# Patient Record
Sex: Male | Born: 1980 | Race: White | Hispanic: No | Marital: Single
Health system: Southern US, Community
[De-identification: ages and names within clinical notes are randomized; demographics above are authoritative.]

---

## 2000-03-31 ENCOUNTER — Emergency Department (HOSPITAL_COMMUNITY): Admission: EM | Admit: 2000-03-31 | Discharge: 2000-03-31 | Payer: Self-pay | Admitting: Podiatry

## 2000-03-31 ENCOUNTER — Emergency Department (HOSPITAL_COMMUNITY): Admission: EM | Admit: 2000-03-31 | Discharge: 2000-03-31 | Payer: Self-pay | Admitting: Emergency Medicine

## 2000-04-01 ENCOUNTER — Emergency Department (HOSPITAL_COMMUNITY): Admission: EM | Admit: 2000-04-01 | Discharge: 2000-04-01 | Payer: Self-pay | Admitting: Emergency Medicine

## 2014-07-22 ENCOUNTER — Other Ambulatory Visit: Payer: Self-pay | Admitting: Physician Assistant

## 2014-07-22 ENCOUNTER — Ambulatory Visit
Admission: RE | Admit: 2014-07-22 | Discharge: 2014-07-22 | Disposition: A | Discharge status: Home / Self Care | Payer: 59 | Source: Ambulatory Visit | Attending: Physician Assistant | Admitting: Physician Assistant

## 2014-07-22 DIAGNOSIS — N50819 Testicular pain, unspecified: Secondary | ICD-10-CM

## 2014-07-22 DIAGNOSIS — N5089 Other specified disorders of the male genital organs: Secondary | ICD-10-CM

## 2015-01-20 ENCOUNTER — Other Ambulatory Visit: Payer: Self-pay | Admitting: Physician Assistant

## 2015-01-20 ENCOUNTER — Ambulatory Visit
Admission: RE | Admit: 2015-01-20 | Discharge: 2015-01-20 | Disposition: A | Discharge status: Home / Self Care | Payer: Self-pay | Source: Ambulatory Visit | Attending: Physician Assistant | Admitting: Physician Assistant

## 2015-01-20 DIAGNOSIS — M2550 Pain in unspecified joint: Secondary | ICD-10-CM

## 2016-03-29 IMAGING — CR DG HIP (WITH OR WITHOUT PELVIS) 5+V BILAT
2 series · 2 of 2 positions shown · non-contrast
Comparison: None.

CLINICAL DATA: Bilateral hip pain

EXAM:
BILATERAL HIP (WITH PELVIS) 5-6 VIEWS

[t hip frog leg right]
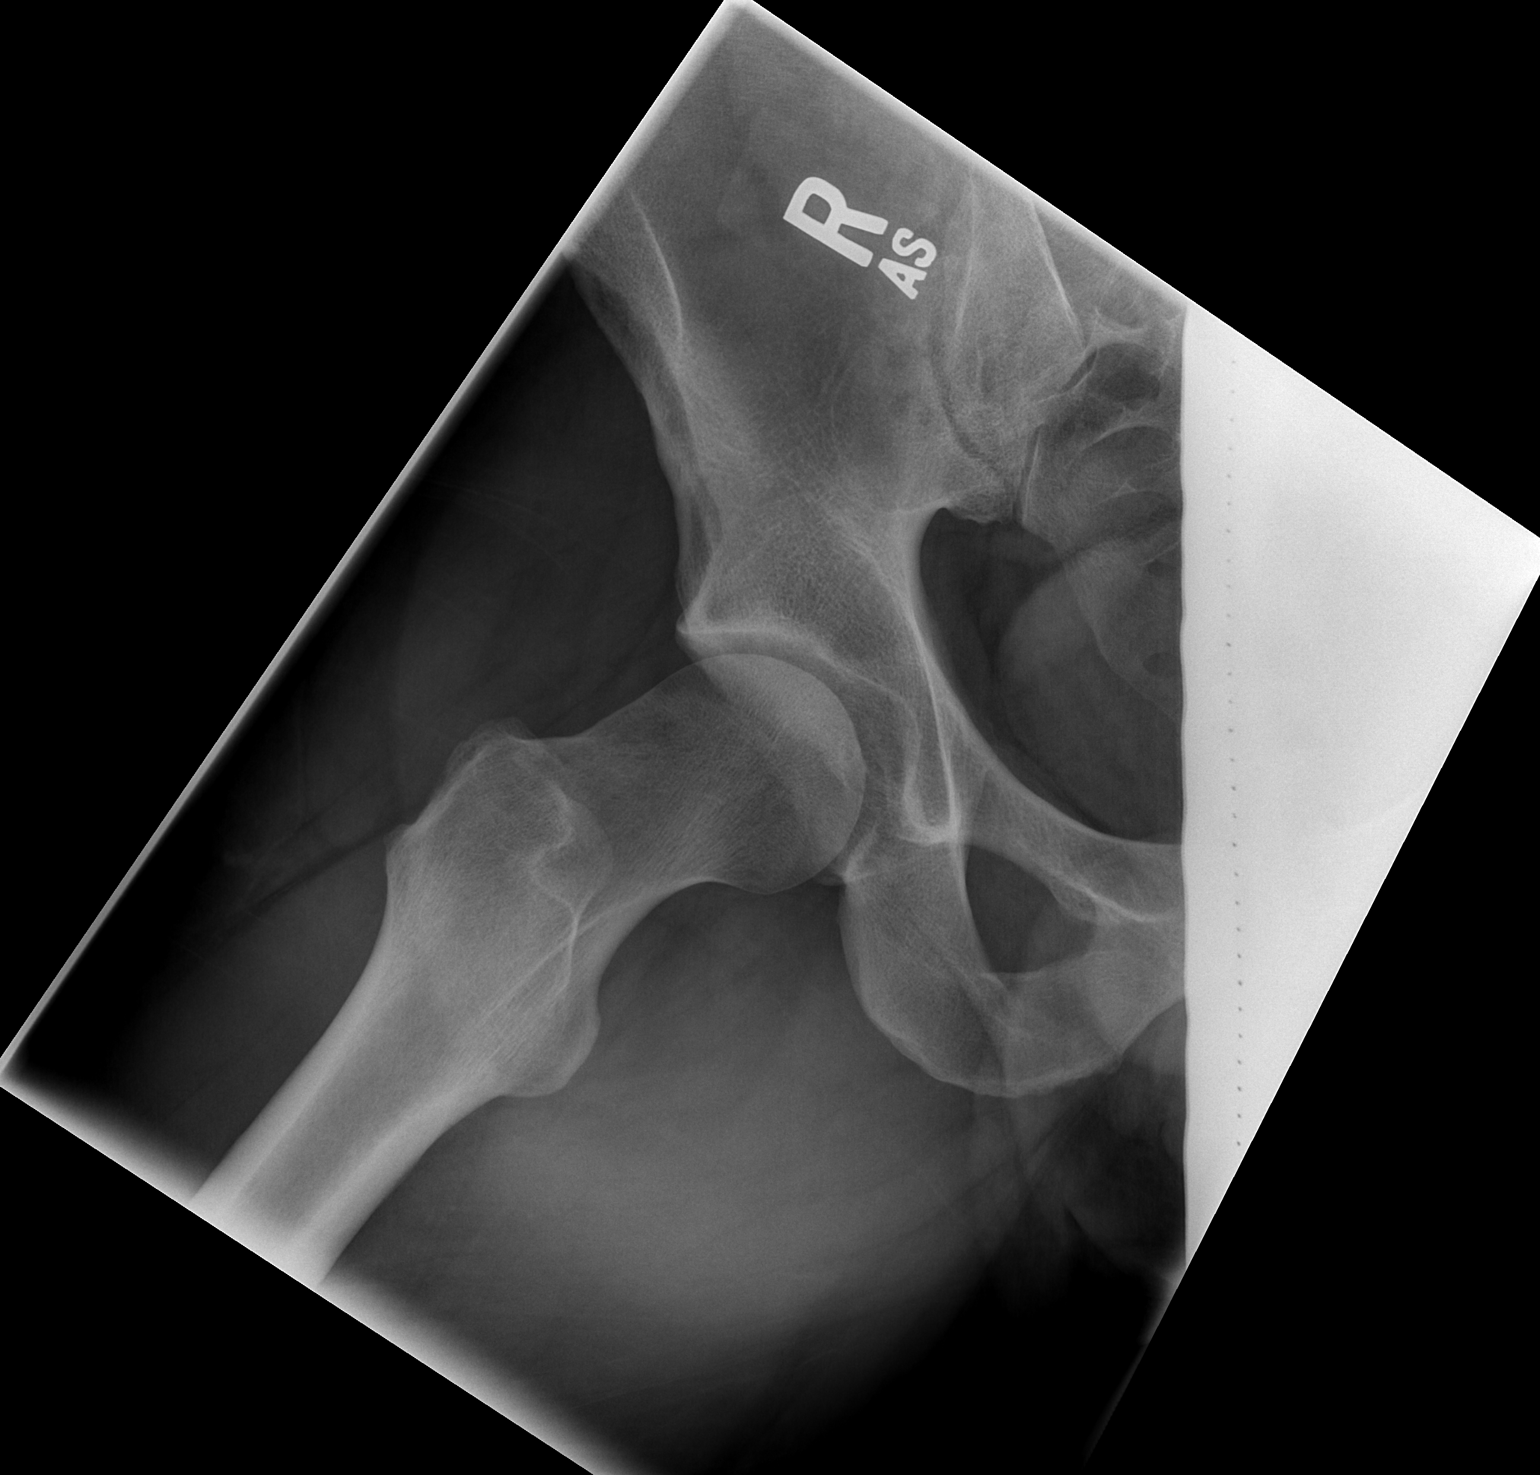

[t hip frog leg left]
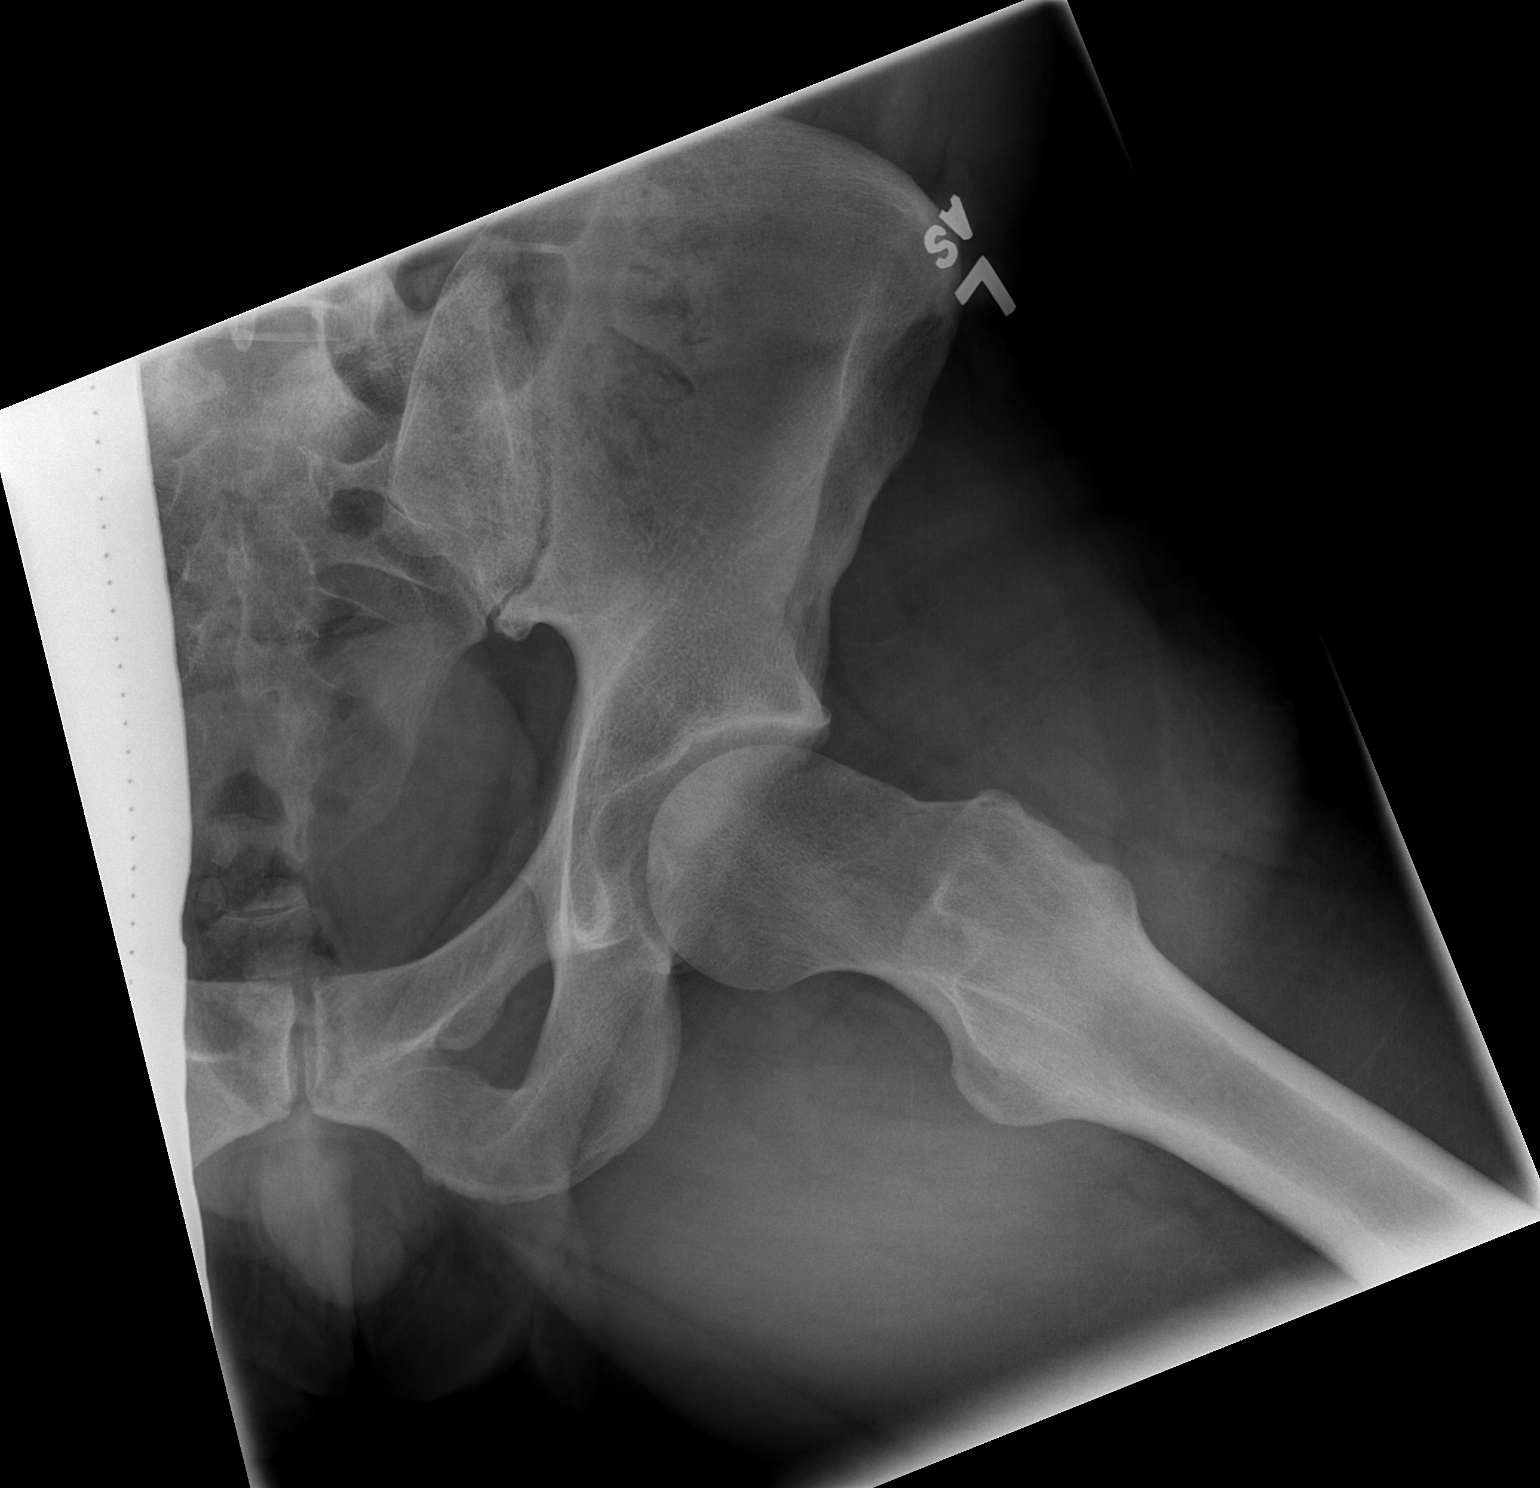

[2 of 2 positions shown; findings below may reference images not displayed]

FINDINGS: There is no evidence of hip fracture or dislocation. There is no
evidence of arthropathy or other focal bone abnormality.
IMPRESSION: Negative.

## 2016-03-29 IMAGING — CR DG KNEE COMPLETE 4+V*R*
3 series · 3 of 3 positions shown · non-contrast
Comparison: None.

CLINICAL DATA: Pain for approximately 8 months. Patient runs for
exercise. No trauma history.

EXAM:
RIGHT KNEE - COMPLETE 4+ VIEW

[w knee ap right]
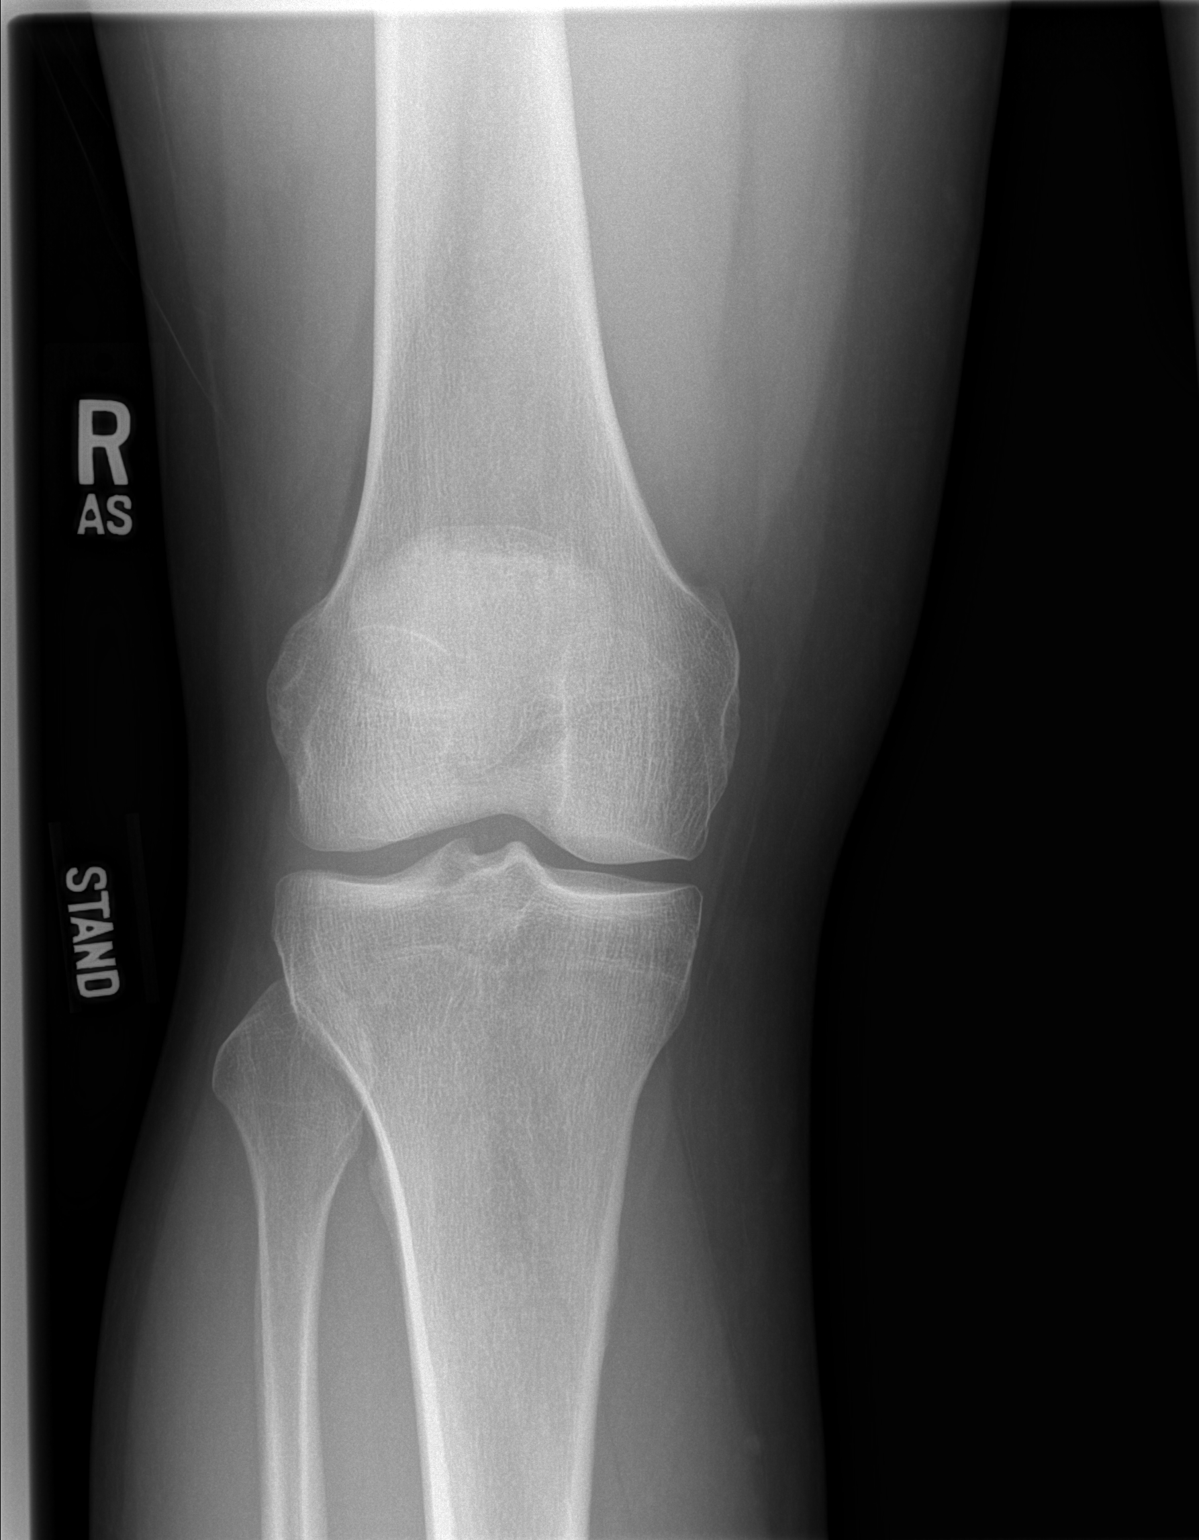

[w knee obl. right]
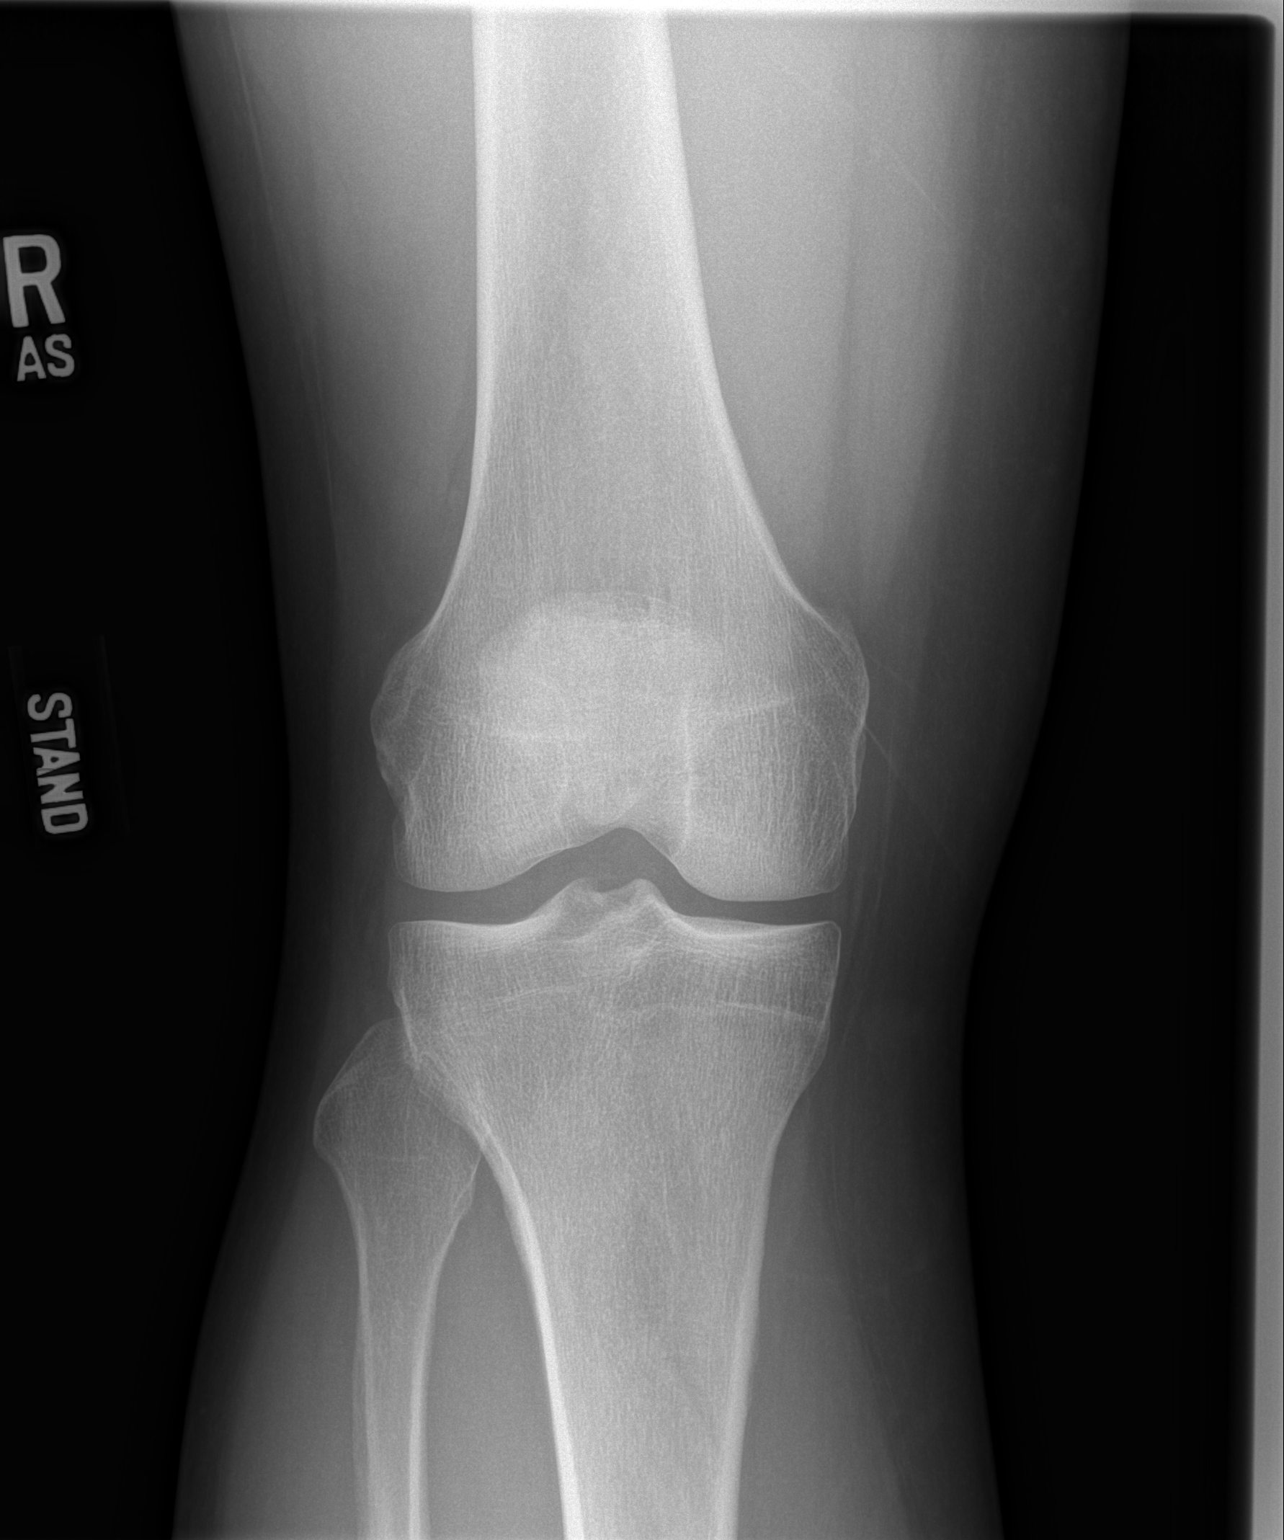

[w knee lat. right]
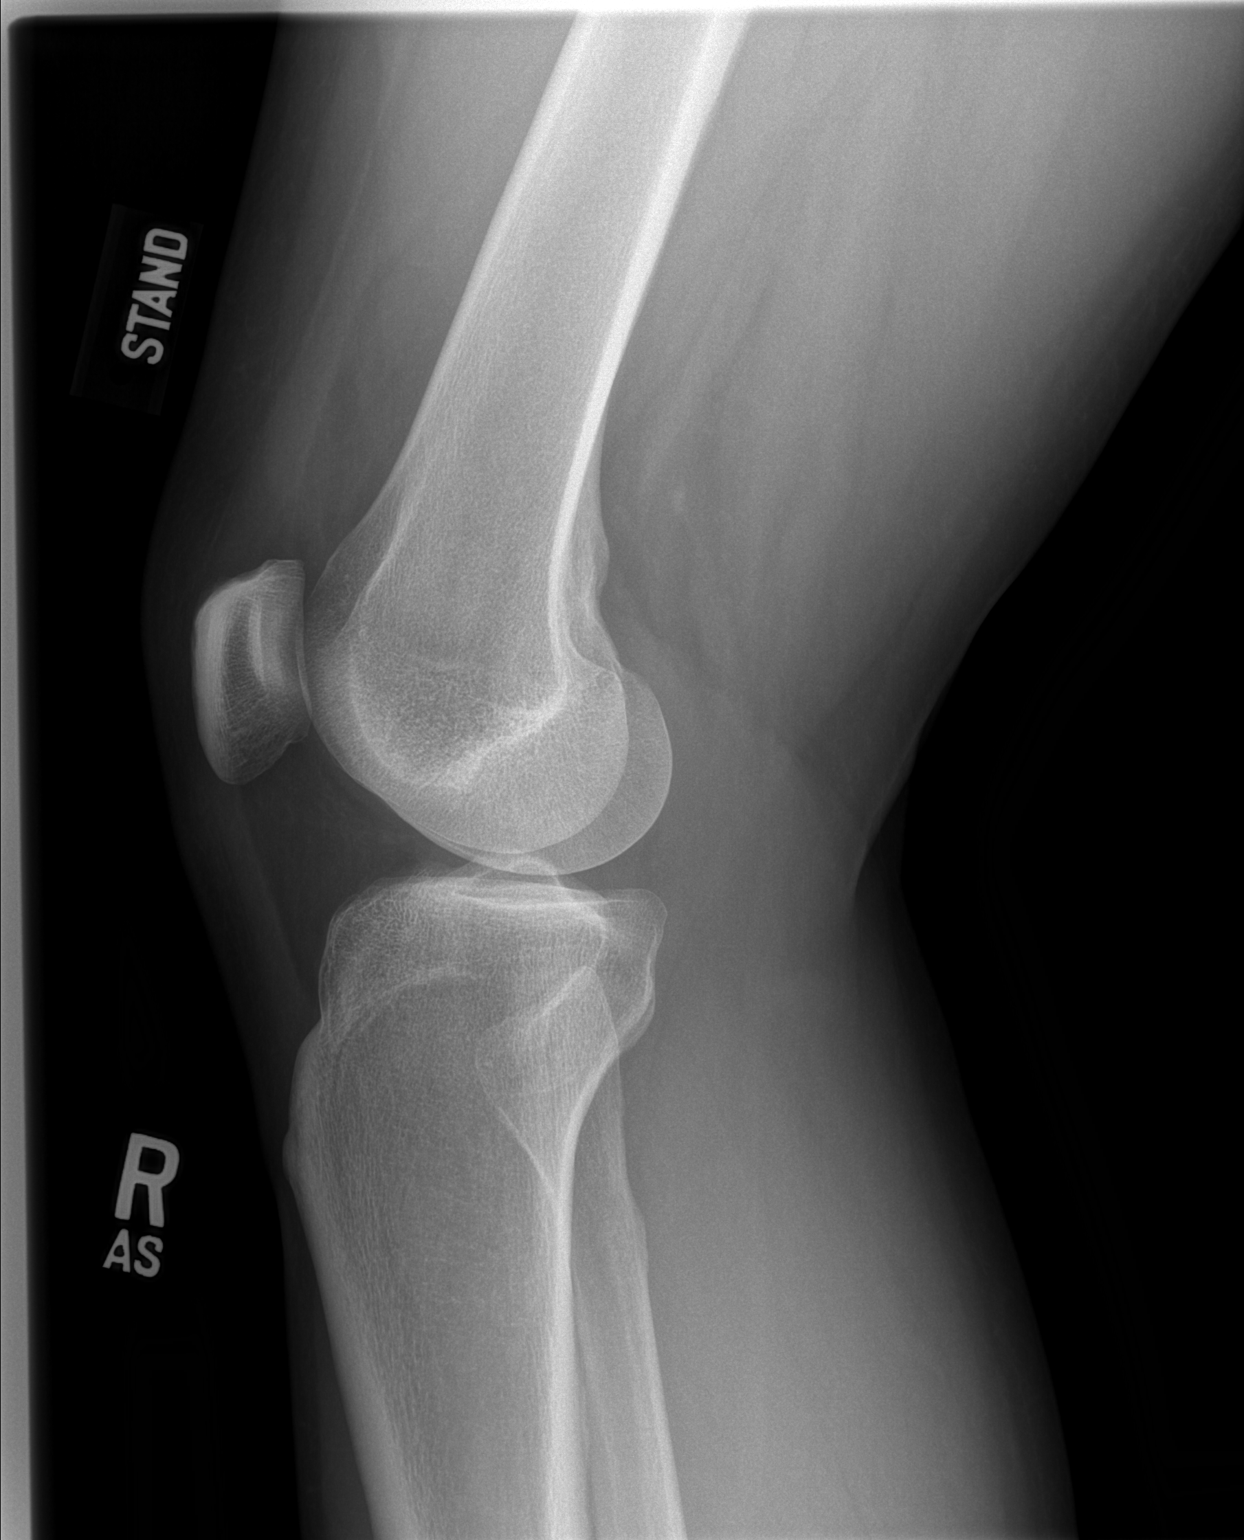

[3 of 3 positions shown; findings below may reference images not displayed]

FINDINGS: Standing frontal, standing tunnel, standing lateral, and sunrise
patellar images were obtained. There is no fracture or dislocation.
No joint effusion. Joint spaces appear intact. No erosive change.
IMPRESSION: No fracture or effusion.  No appreciable arthropathy.

## 2016-12-11 DIAGNOSIS — M25872 Other specified joint disorders, left ankle and foot: Secondary | ICD-10-CM | POA: Diagnosis not present

## 2016-12-11 DIAGNOSIS — M65872 Other synovitis and tenosynovitis, left ankle and foot: Secondary | ICD-10-CM | POA: Diagnosis not present

## 2016-12-11 DIAGNOSIS — M25572 Pain in left ankle and joints of left foot: Secondary | ICD-10-CM | POA: Diagnosis not present

## 2016-12-24 DIAGNOSIS — M25572 Pain in left ankle and joints of left foot: Secondary | ICD-10-CM | POA: Diagnosis not present

## 2017-01-01 DIAGNOSIS — M65872 Other synovitis and tenosynovitis, left ankle and foot: Secondary | ICD-10-CM | POA: Diagnosis not present

## 2017-01-01 DIAGNOSIS — M25872 Other specified joint disorders, left ankle and foot: Secondary | ICD-10-CM | POA: Diagnosis not present

## 2017-01-01 DIAGNOSIS — M25572 Pain in left ankle and joints of left foot: Secondary | ICD-10-CM | POA: Diagnosis not present

## 2017-01-15 ENCOUNTER — Other Ambulatory Visit: Payer: Self-pay | Admitting: Podiatry

## 2017-01-15 DIAGNOSIS — R52 Pain, unspecified: Secondary | ICD-10-CM

## 2017-02-06 DIAGNOSIS — L918 Other hypertrophic disorders of the skin: Secondary | ICD-10-CM | POA: Diagnosis not present

## 2017-02-08 ENCOUNTER — Other Ambulatory Visit: Payer: Self-pay | Admitting: Podiatry

## 2017-02-08 DIAGNOSIS — Z77018 Contact with and (suspected) exposure to other hazardous metals: Secondary | ICD-10-CM

## 2017-02-08 DIAGNOSIS — R52 Pain, unspecified: Secondary | ICD-10-CM

## 2017-02-18 DIAGNOSIS — R197 Diarrhea, unspecified: Secondary | ICD-10-CM | POA: Diagnosis not present

## 2017-02-22 ENCOUNTER — Ambulatory Visit
Admission: RE | Admit: 2017-02-22 | Discharge: 2017-02-22 | Disposition: A | Discharge status: Home / Self Care | Payer: 59 | Source: Ambulatory Visit | Attending: Podiatry | Admitting: Podiatry

## 2017-02-22 DIAGNOSIS — R52 Pain, unspecified: Secondary | ICD-10-CM

## 2017-02-22 DIAGNOSIS — M25572 Pain in left ankle and joints of left foot: Secondary | ICD-10-CM | POA: Diagnosis not present

## 2017-02-22 DIAGNOSIS — Z77018 Contact with and (suspected) exposure to other hazardous metals: Secondary | ICD-10-CM

## 2017-02-26 DIAGNOSIS — R197 Diarrhea, unspecified: Secondary | ICD-10-CM | POA: Diagnosis not present

## 2017-02-27 DIAGNOSIS — R197 Diarrhea, unspecified: Secondary | ICD-10-CM | POA: Diagnosis not present

## 2017-03-05 DIAGNOSIS — R197 Diarrhea, unspecified: Secondary | ICD-10-CM | POA: Diagnosis not present

## 2017-03-06 DIAGNOSIS — R197 Diarrhea, unspecified: Secondary | ICD-10-CM | POA: Diagnosis not present

## 2017-03-12 DIAGNOSIS — R197 Diarrhea, unspecified: Secondary | ICD-10-CM | POA: Diagnosis not present

## 2017-03-13 DIAGNOSIS — R197 Diarrhea, unspecified: Secondary | ICD-10-CM | POA: Diagnosis not present

## 2017-03-28 DIAGNOSIS — K5289 Other specified noninfective gastroenteritis and colitis: Secondary | ICD-10-CM | POA: Diagnosis not present

## 2017-03-28 DIAGNOSIS — R197 Diarrhea, unspecified: Secondary | ICD-10-CM | POA: Diagnosis not present

## 2017-05-06 DIAGNOSIS — K52832 Lymphocytic colitis: Secondary | ICD-10-CM | POA: Diagnosis not present

## 2017-05-06 DIAGNOSIS — M65872 Other synovitis and tenosynovitis, left ankle and foot: Secondary | ICD-10-CM | POA: Diagnosis not present

## 2017-07-30 DIAGNOSIS — K52832 Lymphocytic colitis: Secondary | ICD-10-CM | POA: Diagnosis not present

## 2017-12-04 DIAGNOSIS — R52 Pain, unspecified: Secondary | ICD-10-CM | POA: Diagnosis not present

## 2017-12-04 DIAGNOSIS — J069 Acute upper respiratory infection, unspecified: Secondary | ICD-10-CM | POA: Diagnosis not present

## 2018-01-14 DIAGNOSIS — K52832 Lymphocytic colitis: Secondary | ICD-10-CM | POA: Diagnosis not present

## 2018-03-25 ENCOUNTER — Ambulatory Visit (INDEPENDENT_AMBULATORY_CARE_PROVIDER_SITE_OTHER): Payer: 59

## 2018-03-25 ENCOUNTER — Ambulatory Visit (INDEPENDENT_AMBULATORY_CARE_PROVIDER_SITE_OTHER): Payer: 59 | Admitting: Orthopaedic Surgery

## 2018-03-25 DIAGNOSIS — M65832 Other synovitis and tenosynovitis, left forearm: Secondary | ICD-10-CM

## 2018-03-25 NOTE — Progress Notes (Signed)
   Office Visit Note   Patient: Matthew Mckinney           Date of Birth: 11/11/1980           MRN: [id number] Visit Date: 03/25/2018              Requested by: Matthew Heightedmon, Noelle, PA-C 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  Recommend immobilization and rest with a wrist brace.  Ibuprofen 800 mg 3 times a day.  May resume activities once he starts to feel better.  Questions encouraged and answered.  Follow-Up Instructions: Return if symptoms worsen or fail to improve.   Orders:  Orders Placed This Encounter  Procedures  . XR Wrist Complete Left   No orders of the defined types were placed in this encounter.     Procedures: No procedures performed   Clinical Data: No additional findings.   Subjective: Chief Complaint  Patient presents with  . Left Wrist - Pain    [name] is a very pleasant 37 year old gentleman who comes in with dorsal left wrist pain for about a week.  He states that he was digging post holes and using his arm a lot and he had onset of this pain.  He takes ibuprofen and has worn a wrist brace that has helped.  Denies any numbness and tingling.  Denies any injuries.  He does endorse grinding within the forearm region.   Review of Systems  Constitutional: Negative.   All other systems reviewed and are negative.    Objective: Vital Signs: There were no vitals taken for this visit.  Physical Exam  Constitutional: He is oriented to person, place, and time. He appears well-developed and well-nourished.  HENT:  Head: Normocephalic and atraumatic.  Eyes: Pupils are equal, round, and reactive to light.  Neck: Neck supple.  Pulmonary/Chest: Effort normal.  Abdominal: Soft.  Musculoskeletal: Normal range of motion.  Neurological: He is alert and oriented to person,  place, and time.  Skin: Skin is warm.  Psychiatric: He has a normal mood and affect. His behavior is normal. Judgment and thought content normal.  Nursing note and vitals reviewed.   Ortho Exam Left wrist and forearm exam shows significant crepitus at the intersection of the first and second dorsal wrist compartment.  Negative Finkelstein's. Specialty Comments:  No specialty comments available.  Imaging: Xr Wrist Complete Left  Result Date: 03/25/2018 No acute or structural abnormalities.    PMFS History: There are no active problems to display for this patient.  No past medical history on file.  No family history on file.   Social History   Occupational History  . Not on file  Tobacco Use  . Smoking status: Not on file  Substance and Sexual Activity  . Alcohol use: Not on file  . Drug use: Not on file  . Sexual activity: Not on file

## 2018-05-20 DIAGNOSIS — L918 Other hypertrophic disorders of the skin: Secondary | ICD-10-CM | POA: Diagnosis not present

## 2018-05-27 DIAGNOSIS — K52832 Lymphocytic colitis: Secondary | ICD-10-CM | POA: Diagnosis not present

## 2018-10-06 DIAGNOSIS — J069 Acute upper respiratory infection, unspecified: Secondary | ICD-10-CM | POA: Diagnosis not present

## 2018-10-24 DIAGNOSIS — K52832 Lymphocytic colitis: Secondary | ICD-10-CM | POA: Diagnosis not present

## 2019-04-23 ENCOUNTER — Other Ambulatory Visit: Payer: Self-pay

## 2019-04-23 ENCOUNTER — Ambulatory Visit: Payer: 59 | Admitting: Family Medicine

## 2019-04-23 NOTE — Progress Notes (Signed)
Pt on this provider's schedule as est.care visit.  After connecting with pt and verifying info, pt states he thought he was making an appointment with Psychiatry.  Pt is interested in est with [facility] for depression, med management, and ADHD testing.  Pt has a PCP-Noelle Redmon, PA-C at [facility].  Pt provided with contact info for [facility].  Grier Mitts, MD

## 2019-05-04 ENCOUNTER — Ambulatory Visit (INDEPENDENT_AMBULATORY_CARE_PROVIDER_SITE_OTHER): Payer: 59 | Admitting: Psychology

## 2019-05-04 DIAGNOSIS — F331 Major depressive disorder, recurrent, moderate: Secondary | ICD-10-CM

## 2019-05-04 DIAGNOSIS — F1020 Alcohol dependence, uncomplicated: Secondary | ICD-10-CM

## 2019-05-20 ENCOUNTER — Ambulatory Visit (INDEPENDENT_AMBULATORY_CARE_PROVIDER_SITE_OTHER): Payer: 59 | Admitting: Psychology

## 2019-05-20 DIAGNOSIS — F1020 Alcohol dependence, uncomplicated: Secondary | ICD-10-CM | POA: Diagnosis not present

## 2019-05-20 DIAGNOSIS — F331 Major depressive disorder, recurrent, moderate: Secondary | ICD-10-CM

## 2019-06-04 ENCOUNTER — Ambulatory Visit (INDEPENDENT_AMBULATORY_CARE_PROVIDER_SITE_OTHER): Payer: 59 | Admitting: Psychology

## 2019-06-04 DIAGNOSIS — F331 Major depressive disorder, recurrent, moderate: Secondary | ICD-10-CM

## 2019-06-17 ENCOUNTER — Ambulatory Visit (INDEPENDENT_AMBULATORY_CARE_PROVIDER_SITE_OTHER): Payer: 59 | Admitting: Psychology

## 2019-06-17 DIAGNOSIS — F339 Major depressive disorder, recurrent, unspecified: Secondary | ICD-10-CM | POA: Diagnosis not present

## 2019-06-25 ENCOUNTER — Ambulatory Visit (INDEPENDENT_AMBULATORY_CARE_PROVIDER_SITE_OTHER): Payer: 59 | Admitting: Orthopaedic Surgery

## 2019-06-25 ENCOUNTER — Encounter: Payer: Self-pay | Admitting: Orthopaedic Surgery

## 2019-06-25 ENCOUNTER — Ambulatory Visit: Payer: Self-pay

## 2019-06-25 DIAGNOSIS — M7712 Lateral epicondylitis, left elbow: Secondary | ICD-10-CM

## 2019-06-25 MED ORDER — DICLOFENAC SODIUM 50 MG PO TBEC: 50.0000 mg | DELAYED_RELEASE_TABLET | Freq: Two times a day (BID) | ORAL | 1 refills | Status: AC | PRN

## 2019-06-25 MED ORDER — METHYLPREDNISOLONE ACETATE 40 MG/ML IJ SUSP
40.0000 mg | INTRAMUSCULAR | Status: AC | PRN
  Administered 2019-06-25: 40 mg

## 2019-06-25 MED ORDER — BUPIVACAINE HCL 0.25 % IJ SOLN
0.3300 mL | INTRAMUSCULAR | Status: AC | PRN
  Administered 2019-06-25: .33 mL

## 2019-06-25 MED ORDER — LIDOCAINE HCL 1 % IJ SOLN
1.0000 mL | INTRAMUSCULAR | Status: AC | PRN
  Administered 2019-06-25: 1 mL

## 2019-06-25 NOTE — Progress Notes (Signed)
Office Visit Note   Patient: Matthew Mckinney           Date of Birth: Jul 10, 1981           MRN: [id number] Visit Date: 06/25/2019              Requested by: Billie Ruddy, MD [place],  [place] [address] PCP: Billie Ruddy, MD   Assessment & Plan: Visit Diagnoses:  1. Lateral epicondylitis of left elbow     Plan: Impression is left elbow lateral epicondylitis.  We will proceed with cortisone injection, extensor tendon stretches and a tennis elbow strap today.  He has been instructed to lay off activities over the next 4 to 6 weeks.  Should he not notice any improvement in symptoms, he will call us back and we may entertain doing an MRI.  Follow-up with [place] as needed.  Follow-Up Instructions: Return if symptoms worsen or fail to improve.   Orders:  Orders Placed This Encounter  Procedures   Hand/UE Inj: L elbow   XR Elbow 2 Views Left   Meds ordered this encounter  Medications   diclofenac (VOLTAREN) 50 MG EC tablet    Sig: Take 1 tablet (50 mg total) by mouth 2 (two) times daily as needed.    Dispense:  60 tablet    Refill:  1      Procedures: Hand/UE Inj: L elbow for lateral epicondylitis on 06/25/2019 8:26 AM Indications: pain Details: 22 G needle Medications: 0.33 mL bupivacaine 0.25 %; 1 mL lidocaine 1 %; 40 mg methylPREDNISolone acetate 40 MG/ML      Clinical Data: No additional findings.   Subjective: Chief Complaint  Patient presents with   Left Elbow - Pain    HPI patient is a pleasant 38 year old right-hand-dominant gentleman who presents our clinic today with left lateral elbow pain.  This began approximately 2 to 4 weeks ago without any known injury or change in activity.  He does note that he is an avid weightlifter as well as non-biker.  There is no specific injury which started symptoms.  He has not been lifting weights for the past 4 weeks and does not remove been bike riding for the past 2 weeks.  He notes that  his symptoms have worsened.  All of his pain is to the lateral aspect.  He describes this as a constant pain worse with gripping.  He has been taking ibuprofen without significant relief of symptoms.  He denies any numbness, tingling or burning.  Review of Systems as detailed in HPI.  All others reviewed and are negative.   Objective: Vital Signs: There were no vitals taken for this visit.  Physical Exam well-developed well-nourished gentleman in no acute distress.  Alert and oriented x3.  Ortho Exam examination of his left elbow reveals marked tenderness over the lateral epicondyle.  No tenderness to the radial tunnel.  No posterior elbow tenderness.  Full range of motion and strength with biceps and triceps testing.  Increased pain with gripping.  No pain with wrist flexion, extension or elbow supination or pronation.  He is neurovascular intact distally.  Specialty Comments:  No specialty comments available.  Imaging: Xr Elbow 2 Views Left  Result Date: 06/25/2019 X-rays demonstrate a small spur off the olecranon    PMFS History: There are no active problems to display for this patient.  History reviewed. No pertinent past medical history.  History reviewed. No pertinent family history.  History reviewed.  No pertinent surgical history. Social History   Occupational History   Not on file  Tobacco Use   Smoking status: Not on file  Substance and Sexual Activity   Alcohol use: Not on file   Drug use: Not on file   Sexual activity: Not on file

## 2019-06-26 ENCOUNTER — Ambulatory Visit (INDEPENDENT_AMBULATORY_CARE_PROVIDER_SITE_OTHER): Payer: 59 | Admitting: Psychology

## 2019-06-26 DIAGNOSIS — F331 Major depressive disorder, recurrent, moderate: Secondary | ICD-10-CM | POA: Diagnosis not present

## 2019-07-02 ENCOUNTER — Ambulatory Visit: Payer: 59 | Admitting: Psychology

## 2019-07-09 ENCOUNTER — Ambulatory Visit (INDEPENDENT_AMBULATORY_CARE_PROVIDER_SITE_OTHER): Payer: 59 | Admitting: Psychology

## 2019-07-09 DIAGNOSIS — F1020 Alcohol dependence, uncomplicated: Secondary | ICD-10-CM

## 2019-07-09 DIAGNOSIS — F331 Major depressive disorder, recurrent, moderate: Secondary | ICD-10-CM | POA: Diagnosis not present

## 2019-07-10 ENCOUNTER — Ambulatory Visit: Payer: 59 | Admitting: Psychology

## 2019-07-24 ENCOUNTER — Ambulatory Visit (INDEPENDENT_AMBULATORY_CARE_PROVIDER_SITE_OTHER): Payer: 59 | Admitting: Psychology

## 2019-07-24 DIAGNOSIS — F331 Major depressive disorder, recurrent, moderate: Secondary | ICD-10-CM

## 2019-07-24 DIAGNOSIS — F1020 Alcohol dependence, uncomplicated: Secondary | ICD-10-CM | POA: Diagnosis not present

## 2019-08-07 DIAGNOSIS — F900 Attention-deficit hyperactivity disorder, predominantly inattentive type: Secondary | ICD-10-CM | POA: Diagnosis not present

## 2019-08-07 DIAGNOSIS — F332 Major depressive disorder, recurrent severe without psychotic features: Secondary | ICD-10-CM

## 2019-08-21 ENCOUNTER — Ambulatory Visit (INDEPENDENT_AMBULATORY_CARE_PROVIDER_SITE_OTHER): Payer: 59 | Admitting: Psychology

## 2019-08-21 DIAGNOSIS — F331 Major depressive disorder, recurrent, moderate: Secondary | ICD-10-CM

## 2019-08-21 DIAGNOSIS — F1020 Alcohol dependence, uncomplicated: Secondary | ICD-10-CM | POA: Diagnosis not present

## 2019-09-18 ENCOUNTER — Ambulatory Visit (INDEPENDENT_AMBULATORY_CARE_PROVIDER_SITE_OTHER): Payer: 59 | Admitting: Psychology

## 2019-09-18 DIAGNOSIS — F1020 Alcohol dependence, uncomplicated: Secondary | ICD-10-CM

## 2019-09-18 DIAGNOSIS — F331 Major depressive disorder, recurrent, moderate: Secondary | ICD-10-CM

## 2019-10-23 ENCOUNTER — Ambulatory Visit (INDEPENDENT_AMBULATORY_CARE_PROVIDER_SITE_OTHER): Payer: 59 | Admitting: Psychology

## 2019-10-23 DIAGNOSIS — F1020 Alcohol dependence, uncomplicated: Secondary | ICD-10-CM | POA: Diagnosis not present

## 2019-10-23 DIAGNOSIS — F331 Major depressive disorder, recurrent, moderate: Secondary | ICD-10-CM

## 2019-11-20 ENCOUNTER — Ambulatory Visit (INDEPENDENT_AMBULATORY_CARE_PROVIDER_SITE_OTHER): Payer: 59 | Admitting: Psychology

## 2019-11-20 DIAGNOSIS — F1020 Alcohol dependence, uncomplicated: Secondary | ICD-10-CM

## 2019-11-20 DIAGNOSIS — F331 Major depressive disorder, recurrent, moderate: Secondary | ICD-10-CM | POA: Diagnosis not present

## 2019-12-18 ENCOUNTER — Ambulatory Visit: Payer: 59 | Admitting: Psychology

## 2022-02-07 ENCOUNTER — Encounter: Payer: Self-pay | Admitting: Orthopaedic Surgery

## 2022-02-07 ENCOUNTER — Ambulatory Visit (INDEPENDENT_AMBULATORY_CARE_PROVIDER_SITE_OTHER): Payer: 59

## 2022-02-07 ENCOUNTER — Ambulatory Visit: Payer: 59 | Admitting: Orthopaedic Surgery

## 2022-02-07 DIAGNOSIS — G8929 Other chronic pain: Secondary | ICD-10-CM

## 2022-02-07 DIAGNOSIS — M25562 Pain in left knee: Secondary | ICD-10-CM

## 2022-02-07 NOTE — Progress Notes (Signed)
? ?  Office Visit Note ?  ?Patient: Matthew Mckinney           ?Date of Birth: 22-Apr-1981           ?MRN: [id number] ?Visit Date: 02/07/2022 ?             ?Requested by: Deeann Saint, MD ?[phone] ?[place],  [place] [address] ?PCP: Deeann Saint, MD ? ? ?Assessment & Plan: ?Visit Diagnoses:  ?1. Chronic pain of left knee   ? ? ?Plan: Impression is chronic left knee pain with activity.  It sounds like the patient may have a small medial meniscus tear and I have proposed injecting the left knee with cortisone.  I have also discussed taking the next few weeks off from activity.  He is agreeable to this plan.  If his symptoms do not improve over the next several weeks he will let us know we will get an MRI to assess for structural abnormalities.  Call with concerns or questions. ? ?Follow-Up Instructions: Return if symptoms worsen or fail to improve.  ? ?Orders:  ?Orders Placed This Encounter  ?Procedures  ? Large Joint Inj: L knee  ? XR KNEE 3 VIEW LEFT  ? ?No orders of the defined types were placed in this encounter. ? ? ? ? Procedures: ?Large Joint Inj: L knee on 02/07/2022 9:00 AM ?Indications: pain ?Details: 22 G needle, anterolateral approach ?Medications: 2 mL lidocaine 1 %; 2 mL bupivacaine 0.25 %; 40 mg methylPREDNISolone acetate 40 MG/ML ? ? ? ? ?Clinical Data: ?No additional findings. ? ? ?Subjective: ?Chief Complaint  ?Patient presents with  ? Left Knee - Pain  ? ? ?HPI patient is a pleasant 41 year old gentleman who comes in today with left knee pain for the past 3 months.  He notes that he was trying to exercise by means of running a few miles once a week in the winter.  There was a run that he went on about 3 months ago when he noticed achiness which occurred into the following day.  His pain subsided.  Ever since then, each time he runs greater than 1 mile he notices the return of the pain.  The pain is primarily to the medial aspect.  No mechanical symptoms.  He has taken occasional  ibuprofen with mild relief. ? ?Review of Systems as detailed in HPI.  All others reviewed and are negative. ? ? ?Objective: ?Vital Signs: There were no vitals taken for this visit. ? ?Physical Exam well-developed well-nourished gentleman in no acute distress.  Alert and oriented x3. ? ?Ortho Exam left knee exam shows no effusion.  Range of motion 0 to 130 degrees.  Mild medial joint line tenderness.  Ligaments are stable.  He is neurovascular intact distally. ? ?Specialty Comments:  ?No specialty comments available. ? ?Imaging: ?XR KNEE 3 VIEW LEFT ? ?Result Date: 02/07/2022 ?No acute or structural abnormalities  ? ? ?PMFS History: ?There are no problems to display for this patient. ? ?History reviewed. No pertinent past medical history.  ?History reviewed. No pertinent family history.  ?History reviewed. No pertinent surgical history. ?Social History  ? ?Occupational History  ? Not on file  ?Tobacco Use  ? Smoking status: Not on file  ? Smokeless tobacco: Not on file  ?Substance and Sexual Activity  ? Alcohol use: Not on file  ? Drug use: Not on file  ? Sexual activity: Not on file  ? ? ? ? ? ? ?

## 2022-02-08 MED ORDER — METHYLPREDNISOLONE ACETATE 40 MG/ML IJ SUSP
40.0000 mg | INTRAMUSCULAR | Status: AC | PRN
  Administered 2022-02-07: 40 mg via INTRA_ARTICULAR

## 2022-02-08 MED ORDER — BUPIVACAINE HCL 0.25 % IJ SOLN
2.0000 mL | INTRAMUSCULAR | Status: AC | PRN
  Administered 2022-02-07: 2 mL via INTRA_ARTICULAR

## 2022-02-08 MED ORDER — LIDOCAINE HCL 1 % IJ SOLN
2.0000 mL | INTRAMUSCULAR | Status: AC | PRN
  Administered 2022-02-07: 2 mL

## 2022-03-30 ENCOUNTER — Telehealth: Payer: Self-pay | Admitting: Orthopaedic Surgery

## 2022-03-30 NOTE — Telephone Encounter (Signed)
Pt called requesting a referral for MRI of knee. Please call pt about this matter at [phone].

## 2022-04-02 ENCOUNTER — Other Ambulatory Visit: Payer: Self-pay

## 2022-04-02 DIAGNOSIS — G8929 Other chronic pain: Secondary | ICD-10-CM

## 2022-04-08 ENCOUNTER — Ambulatory Visit
Admission: RE | Admit: 2022-04-08 | Discharge: 2022-04-08 | Disposition: A | Discharge status: Home / Self Care | Payer: 59 | Source: Ambulatory Visit | Attending: Orthopaedic Surgery | Admitting: Orthopaedic Surgery

## 2022-04-08 DIAGNOSIS — G8929 Other chronic pain: Secondary | ICD-10-CM

## 2022-05-08 ENCOUNTER — Ambulatory Visit: Payer: 59 | Admitting: Orthopaedic Surgery

## 2022-05-08 DIAGNOSIS — M25562 Pain in left knee: Secondary | ICD-10-CM | POA: Diagnosis not present

## 2022-05-08 DIAGNOSIS — G8929 Other chronic pain: Secondary | ICD-10-CM | POA: Diagnosis not present

## 2022-05-08 NOTE — Progress Notes (Signed)
   Office Visit Note   Patient: Matthew Mckinney           Date of Birth: 1981-07-01           MRN: [id number] Visit Date: 05/08/2022              Requested by: Deeann Saint, MD [address] [place],  [place] [address] PCP: Deeann Saint, MD   Assessment & Plan: Visit Diagnoses:  1. Chronic pain of left knee     Plan: MRI of the left knee shows no structural abnormalities.  There is some inflammation and thickening of the quadriceps fat pad.  These findings were reviewed with the patient in detail.  Treatment options were reviewed to include scheduled NSAID, cortisone injection, continued rest, arthroscopic resection of the fat pad.  Together we agreed that the best course of action is for continued relative rest and NSAID and increase activity as tolerated.  Follow-up as needed.  Follow-Up Instructions: No follow-ups on file.   Orders:  No orders of the defined types were placed in this encounter.  No orders of the defined types were placed in this encounter.     Procedures: No procedures performed   Clinical Data: No additional findings.   Subjective: Chief Complaint  Patient presents with   Left Knee - Pain    HPI Patient returns today to discuss left knee MRI.  States that he has taken some short periods off from running which have improved his pain.  He is currently asymptomatic. Review of Systems   Objective: Vital Signs: There were no vitals taken for this visit.  Physical Exam  Ortho Exam Examination of left knee is unremarkable. Specialty Comments:  No specialty comments available.  Imaging: No results found.   PMFS History: There are no problems to display for this patient.  No past medical history on file.  No family history on file.  No past surgical history on file. Social History   Occupational History   Not on file  Tobacco Use   Smoking status: Not on file   Smokeless tobacco: Not on file  Substance and Sexual  Activity   Alcohol use: Not on file   Drug use: Not on file   Sexual activity: Not on file

## 2022-07-12 ENCOUNTER — Ambulatory Visit: Payer: Self-pay

## 2022-07-12 ENCOUNTER — Ambulatory Visit: Payer: 59 | Admitting: Orthopaedic Surgery

## 2022-07-12 ENCOUNTER — Encounter: Payer: Self-pay | Admitting: Orthopaedic Surgery

## 2022-07-12 ENCOUNTER — Ambulatory Visit (INDEPENDENT_AMBULATORY_CARE_PROVIDER_SITE_OTHER): Payer: 59

## 2022-07-12 VITALS — BP 162/90 | HR 79

## 2022-07-12 DIAGNOSIS — M25551 Pain in right hip: Secondary | ICD-10-CM

## 2022-07-12 NOTE — Progress Notes (Signed)
   Office Visit Note   Patient: Matthew Mckinney           Date of Birth: 05/07/1981           MRN: [id number] Visit Date: 07/12/2022              Requested by: Billie Ruddy, MD [place],  [place] [address] PCP: Billie Ruddy, MD   Assessment & Plan: Visit Diagnoses:  1. Pain in right hip     Plan: Impression is right hip pain, popping and catching concerning for questionable internal snapping hip and labral tear.  At this point, would like to refer the patient to Dr. Rolena Infante for diagnostic and hopefully therapeutic ultrasound-guided right hip cortisone injection.  I would also like to send him to 1 session of outpatient physical therapy for the newly on home exercise program on his condition.  If his symptoms do not improve over the next 1 to 2 months he will let us know we will likely order an MR arthrogram of the right hip.  Call with concerns or questions.  Follow-Up Instructions: Return if symptoms worsen or fail to improve.   Orders:  Orders Placed This Encounter  Procedures   XR HIP UNILAT W OR W/O PELVIS 2-3 VIEWS RIGHT   No orders of the defined types were placed in this encounter.     Procedures: No procedures performed   Clinical Data: No additional findings.   Subjective: Chief Complaint  Patient presents with   Right Hip - Pain    HPI patient is a pleasant 41 year old gentleman who comes in today with right hip pain, popping and catching.  The pain he has to the groin.  He noticed this about 3 to 4 months ago and he has had 2-3 episodes.  He notes that the first pop he gets is relatively severe and causes discomfort for about a week.  Any subsequent pops soon after the first seems to be less severe.  He notes that the symptoms have occurred when he is pivoting but does not notice any aggravation with walking or running in a straight line.  He denies any paresthesias to the right lower extremity.  Review of Systems as detailed in  HPI.  All others reviewed and are negative.   Objective: Vital Signs: There were no vitals taken for this visit.  Physical Exam well-developed well-nourished gentleman in no acute distress but alert and oriented x3.  Ortho Exam right hip exam reveals no pain with logroll, FADIR Stinchfield.  No tenderness to greater trochanter.  No pain with resisted hip flexion, abduction or abduction.  He is neurovascular intact distally.  Specialty Comments:  No specialty comments available.  Imaging: XR HIP UNILAT W OR W/O PELVIS 2-3 VIEWS RIGHT  Result Date: 07/12/2022 Small pincer defect.  Otherwise, no acute or structural abnormalities    PMFS History: There are no problems to display for this patient.  History reviewed. No pertinent past medical history.  No family history on file.  History reviewed. No pertinent surgical history. Social History   Occupational History   Not on file  Tobacco Use   Smoking status: Not on file   Smokeless tobacco: Not on file  Substance and Sexual Activity   Alcohol use: Not on file   Drug use: Not on file   Sexual activity: Not on file

## 2022-07-12 NOTE — Progress Notes (Signed)
   Procedure Note  Patient: Matthew Mckinney             Date of Birth: 02-12-1981           MRN: [id number]             Visit Date: 07/12/2022  Procedures: Visit Diagnoses:  1. Pain in right hip     Large Joint Inj: R hip joint on 07/12/2022 9:39 AM Indications: pain Details: 22 G 3.5 in needle, ultrasound-guided anterior approach Medications: 4 mL lidocaine 1 %; 80 mg methylPREDNISolone acetate 40 MG/ML  Procedure: US-guided intra-articular hip injection, right   After discussion on risks/benefits/indications and informed verbal consent was obtained, a timeout was performed. Patient was lying supine on exam table. The hip was cleaned with betadine and alcohol swabs. Then utilizing ultrasound guidance, the patient's femoral head and neck junction was identified and subsequently injected with 4:2 lidocaine:depomedrol via an in-plane approach with ultrasound visualization of the injectate administered into the hip joint. Patient tolerated procedure well without immediate complications.  Procedure, treatment alternatives, risks and benefits explained, specific risks discussed. Consent was given by the patient. Immediately prior to procedure a time out was called to verify the correct patient, procedure, equipment, support staff and site/side marked as required. Patient was prepped and draped in the usual sterile fashion.     - I evaluated the patient about 10 minutes post-injection and they had improvement in pain and range of motion - follow-up with Dr. Erlinda Hong as indicated; I am happy to see them as needed  Elba Barman, DO [place]  This note was dictated using Dragon naturally speaking software and may contain errors in syntax, spelling, or content which have not been identified prior to signing this note.

## 2022-07-16 MED ORDER — LIDOCAINE HCL 1 % IJ SOLN
4.0000 mL | INTRAMUSCULAR | Status: AC | PRN
  Administered 2022-07-12: 4 mL

## 2022-07-16 MED ORDER — METHYLPREDNISOLONE ACETATE 40 MG/ML IJ SUSP
80.0000 mg | INTRAMUSCULAR | Status: AC | PRN
  Administered 2022-07-12: 80 mg via INTRA_ARTICULAR

## 2022-07-20 NOTE — Therapy (Addendum)
OUTPATIENT PHYSICAL THERAPY EVALUATION /DISCHARGE   Patient Name: Matthew Mckinney MRN: [id number] DOB:31-May-1981, 41 y.o., male Today's Date: 07/23/2022  END OF SESSION:   PT End of Session - 07/23/22 1511     Visit Number 1    Number of Visits 10    Date for PT Re-Evaluation 10/01/22    Authorization Type [employer] $10 copay , 60 limit    Authorization - Visit Number 1    Authorization - Number of Visits 60    Progress Note Due on Visit 10    PT Start Time 0932    PT Stop Time 1545    PT Time Calculation (min) 32 min    Activity Tolerance Patient tolerated treatment well    Behavior During Therapy WFL for tasks assessed/performed             History reviewed. No pertinent past medical history. History reviewed. No pertinent surgical history. There are no problems to display for this patient.   PCP: Billie Ruddy MD  REFERRING PROVIDER: Nathaniel Man  REFERRING DIAG: [id number] (ICD-10-CM) - Pain in right hip  Rationale for Evaluation and Treatment Rehabilitation  THERAPY DIAG:  Pain in right hip  Muscle weakness (generalized)  Difficulty in walking, not elsewhere classified  ONSET DATE: June 2023  SUBJECTIVE:                                                                                                                                                                                           SUBJECTIVE STATEMENT: Pt indicated he was getting ready to go on a flight and he turned in house and felt pop in hip with sharp pain.  Pt indicated difficulty walking/moving rest of the day.  Pt indicated popping happened a few more times afterward at times.  Pt indicated injection was helpful.  Pt indicated standing with Rt hip abduction and pressure into floor caused pain prior to injection.  Pt indicated better since the injection.    PERTINENT HISTORY:  History of injection Rt hip 07/12/2022, gout.  Pt indicated history of Rt knee pain in history.   PAIN:   NPRS scale: at best 0, at worst since injection 2/10.  At worst 9/10 Pain location: Rt hip Pain description: sharp pain Aggravating factors: Rotation turns in WB, hip abduction and pressure into floor caused pain prior to injection.  Relieving factors: injection, rest    PRECAUTIONS: None  WEIGHT BEARING RESTRICTIONS No  FALLS:  Has patient fallen in last 6 months? No  LIVING ENVIRONMENT: Lives in: House/apartment Stairs: step to enter   OCCUPATION: IT work   PLOF:  Independent, running for exercise in past.  Riding bike. House/yard work  PATIENT GOALS    Reduce pain.  Pt indicated he wanted to get past this to be able to exercise including some walking/running.    OBJECTIVE:   PATIENT SURVEYS:  07/23/2022 FOTO intake: 38    predicted:  88  SCREENING FOR RED FLAGS: 07/23/2022 Bowel or bladder incontinence: No Cauda equina syndrome: No  COGNITION: 07/23/2022  Overall cognitive status: WFL normal    SENSATION: 07/23/2022 Not tested  MUSCLE LENGTH: 07/23/2022 No specific testing  POSTURE:  07/23/2022 Unremarkable   PALPATION: 07/23/2022 No specific tenderness noted  LUMBAR ROM:    AROM  07/23/2022  Flexion   Extension   Right lateral flexion   Left lateral flexion   Right rotation   Left rotation    (Blank rows = not tested)  LOWER EXTREMITY ROM:     AROM Right 07/23/2022 Left 07/23/2022  Hip flexion [facility]  Hip extension    Hip abduction    Hip adduction    Hip internal rotation 33 AROM in sitting 22 AROM in sitting  Hip external rotation 33 AROM in sitting 45 AROM in sitting  Knee flexion    Knee extension    Ankle dorsiflexion    Ankle plantarflexion    Ankle inversion    Ankle eversion     (Blank rows = not tested)  LOWER EXTREMITY MMT:    MMT Right 07/23/2022 Left 07/23/2022  Hip flexion 4/5 5/5  Hip extension 4/5 4/5  Hip abduction 4/5 4+/5  Hip adduction    Hip internal rotation    Hip external rotation    Knee  flexion 5/5 5/5  Knee extension 5/5 5/5  Ankle dorsiflexion 5/5 5/5  Ankle plantarflexion    Ankle inversion    Ankle eversion     (Blank rows = not tested)  FUNCTIONAL TESTS:  07/23/2022 18 inch transfer without UE on 1st Rt SLS:  20 seconds c mild aberrant movements.    Lt SLS 25 seconds c very mild aberrant movements (occasional)  Double leg squat to 90 deg hip flexion with good control, no complaints  Split squat Rt c mild valgus movement in femur/knee compared to Lt.   GAIT: 07/23/2022 Independent ambulation within clinic    TODAY'S TREATMENT  07/23/2022 Therex:    HEP instruction/performance c cues for techniques, handout provided.  Trial set performed of each for comprehension and symptom assessment.  See below for exercise list    PATIENT EDUCATION:  07/23/2022 Education details: HEP, POC Person educated: Patient Education method: Explanation, Demonstration, Verbal cues, and Handouts Education comprehension: verbalized understanding, returned demonstration, and verbal cues required    HOME EXERCISE PROGRAM: Access Code: [email or web address] URL: [email or web address] Date: 07/23/2022 Prepared by: Scot Jun  Exercises - Supine Figure 4 Piriformis Stretch  - 2 x daily - 7 x weekly - 1 sets - 5 reps - 30 hold - Hooklying Single Knee to Chest Stretch  - 2 x daily - 7 x weekly - 1 sets - 5 reps - 30 hold - Supine Bridge  - 1-2 x daily - 7 x weekly - 1-2 sets - 10 reps - 2 hold - Figure 4 Bridge  - 1-2 x daily - 7 x weekly - 1-2 sets - 10 reps - Sidelying Hip Abduction  - 1-2 x daily - 7 x weekly - 1 sets - 10 reps - Side Plank on Knees  - 1-2 x daily - 7 x weekly -  1 sets - 5 reps - Modified Side Plank with Hip Abduction  - 1 x daily - 7 x weekly - 1-2 sets - 10 reps  ASSESSMENT:  CLINICAL IMPRESSION: Patient is a 41 y.o. who comes to clinic with complaints of Rt hip pain with mobility, strength and movement coordination deficits that impair their  ability to perform usual daily and recreational functional activities without increase difficulty/symptoms at this time.  Patient to benefit from skilled PT services to address impairments and limitations to improve to previous level of function without restriction secondary to condition.    OBJECTIVE IMPAIRMENTS decreased activity tolerance, decreased balance, decreased coordination, decreased endurance, decreased mobility, difficulty walking, decreased ROM, decreased strength, hypomobility, impaired perceived functional ability, impaired flexibility, improper body mechanics, and pain.   ACTIVITY LIMITATIONS carrying, lifting, bending, standing, squatting, transfers, and locomotion level  PARTICIPATION LIMITATIONS: cleaning, laundry, interpersonal relationship, community activity, and yard work  PERSONAL FACTORS No factors listed are also affecting patient's functional outcome.   REHAB POTENTIAL: Good  CLINICAL DECISION MAKING: Stable/uncomplicated  EVALUATION COMPLEXITY: Low   GOALS: Goals reviewed with patient? Yes  Short term PT Goals (target date for Short term goals are 3 weeks 08/13/2022) Patient will demonstrate independent use of home exercise program to maintain progress from in clinic treatments. Goal status: New   Long term PT goals (target dates for all long term goals are 10 weeks  10/01/2022 )   1. Patient will demonstrate/report pain at worst less than or equal to 2/10 to facilitate minimal limitation in daily activity secondary to pain symptoms. Goal status: New   2. Patient will demonstrate independent use of home exercise program to facilitate ability to maintain/progress functional gains from skilled physical therapy services. Goal status: New   3. Patient will demonstrate FOTO outcome > or = 62 % to indicate reduced disability due to condition. Goal status: New   4. Patient will demonstrate bilateral hip MMT 5/5 throughout to facilitate lifting, squatting,  exercise at PLOF.  Goal status: New   5.  Patient will demonstrate bilateral SLS s deviation 30 seconds to facilitate stability in ambulation.    Goal status: New   6.  Patient will demonstrate return to exercise routine s limitation.   Goal status: New     PLAN: PT FREQUENCY: 1x/week  PT DURATION: 10 weeks  PLANNED INTERVENTIONS: Therapeutic exercises, Therapeutic activity, Neuro Muscular re-education, Balance training, Gait training, Patient/Family education, Joint mobilization, Stair training, DME instructions, Dry Needling, Electrical stimulation, Cryotherapy, Moist heat, Taping, Traction Ultrasound, Ionotophoresis 29m/ml Dexamethasone, and Manual therapy.  All included unless contraindicated   PLAN FOR NEXT SESSION: Review HEP knowledge/results.   Recheck exercise return.   MScot Jun PT, DPT, OCS, ATC 07/23/22  3:52 PM    PHYSICAL THERAPY DISCHARGE SUMMARY  Visits from Start of Care: 1  Current functional level related to goals / functional outcomes: See note   Remaining deficits: See note   Education / Equipment: HEP  Patient goals were partially met. Patient is being discharged due to not returning since the last visit.  MScot Jun PT, DPT, OCS, ATC 09/26/22  10:04 AM

## 2022-07-23 ENCOUNTER — Encounter: Payer: Self-pay | Admitting: Rehabilitative and Restorative Service Providers"

## 2022-07-23 ENCOUNTER — Ambulatory Visit: Payer: 59 | Admitting: Rehabilitative and Restorative Service Providers"

## 2022-07-23 ENCOUNTER — Other Ambulatory Visit: Payer: Self-pay

## 2022-07-23 DIAGNOSIS — M25551 Pain in right hip: Secondary | ICD-10-CM | POA: Diagnosis not present

## 2022-07-23 DIAGNOSIS — R262 Difficulty in walking, not elsewhere classified: Secondary | ICD-10-CM

## 2022-07-23 DIAGNOSIS — M6281 Muscle weakness (generalized): Secondary | ICD-10-CM | POA: Diagnosis not present

## 2022-08-01 ENCOUNTER — Ambulatory Visit (INDEPENDENT_AMBULATORY_CARE_PROVIDER_SITE_OTHER): Payer: 59 | Admitting: Psychology

## 2022-08-01 DIAGNOSIS — F331 Major depressive disorder, recurrent, moderate: Secondary | ICD-10-CM

## 2022-08-01 DIAGNOSIS — F1020 Alcohol dependence, uncomplicated: Secondary | ICD-10-CM | POA: Diagnosis not present

## 2022-08-01 NOTE — Progress Notes (Signed)
[place] Counselor Initial Adult Exam  Name: Matthew Mckinney Date: 08/01/2022 MRN: [id number] DOB: 31-Jan-1981 PCP: Billie Ruddy, MD  Time spent: 45 mins  Guardian/Payee:  Pt    Paperwork requested: No   Reason for Visit /Presenting Problem: Pt presents for session via webex video.  Pt grants consent for session, stating he is in his home with no one else present.  I shared with pt that I am in my office with no one else here either.  Mental Status Exam: Appearance:   Casual     Behavior:  Appropriate  Motor:  Normal  Speech/Language:   Clear and Coherent  Affect:  Appropriate  Mood:  normal  Thought process:  normal  Thought content:    WNL  Sensory/Perceptual disturbances:    WNL  Orientation:  oriented to person, place, and time/date  Attention:  Good  Concentration:  Good  Memory:  WNL  Fund of knowledge:   Good  Insight:    Good  Judgment:   Fair  Impulse Control:  Fair   Reported Symptoms:  Pt shares he has been doing well since our last session.  "I think I have slowly unraveled since we saw each last time (11/2019).  Pt shares he has continued his drinking back to his previous pattern; pt reports drinking to blacking out out again.  "There was an outburst between me and [name]" (pt gets emotional describing what happened). "She had a bad day at school and wanted to lay out the next day (a couple of weeks ago).  She would not give [place] details of what caused the bad day.  There was no communication from the school about any incident.  [name] wanted to try out for soccer at [employer].  She would not tell us what happened.  She kept saying, 'I don't want to talk about it, I am just not going to school tomorrow.'  She screamed at me that she did not want to talk to me and I stood up to her and I was screaming back at her.  [name] got in between [place] and said that if I did not calm down, she was going to call the police.  I flipped out and I don't remember a  lot of it.  I know what I did was wrong and I stormed out of the house.  [name] did not call the police.  I went to my mom and dad's and spent the night there.  I was so ashamed.  I know this was not right and it was not healthy.  I came home the next day and had a conversation with [name]; she told me it was OK to come back.  [name] stayed home from school the next day, so she got her way.  I have not talked to [name] about it one on one.  I am trying to move slower around the kids and I try to talk with [name] before talking with the kids about stressful things."  Pt shares this was not the first time he has yelled at the family.    Risk Assessment: Danger to Self:  Yes.  without intent/plan Self-injurious Behavior: No Danger to Others: No Duty to Warn:no Physical Aggression / Violence:No  Access to Firearms a concern: Yes ; locked up in a safe; pt agrees to give keys to the safe to [name] until further notice Gang Involvement:No  Patient / guardian was educated about steps to take if suicide  or homicide risk level increases between visits: yes While future psychiatric events cannot be accurately predicted, the patient does not currently require acute inpatient psychiatric care and does not currently meet [facility] involuntary commitment criteria.  Substance Abuse History: Current substance abuse: Yes ; pt drinks daily, even since the event with [name]; "I drink between 6-8 low alcohol beers per day.  Past Psychiatric History:   Previous psychological history is significant for depression Outpatient Providers:this provider History of Psych Hospitalization: No   Psychological Testing: Attention/ADHD:  unknown    Abuse History:  Victim of: No.,  none    Report needed: No. Victim of Neglect:No. Perpetrator of  none   Witness / Exposure to Domestic Violence: No   Protective Services Involvement: No  Witness to [employer] Violence:  No   Family History: No family history on file.  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 History of legal issue / charges:  none  Medical History/Surgical History: reviewed; ADHD, Colitis, Gout No past medical history on file.  No past surgical history on file.  Medications: Current Outpatient Medications  Medication Sig Dispense Refill   allopurinol (ZYLOPRIM) 100 MG tablet  (Patient not taking: Reported on 07/23/2022)     diclofenac (VOLTAREN) 50 MG EC tablet Take 1 tablet (50 mg total) by mouth 2 (two) times daily as needed. (Patient not taking: Reported on 07/23/2022) 60 tablet 1   No current facility-administered medications for this visit.    Not on File  Diagnoses:  Alcohol use disorder, moderate, in controlled environment, dependence ([place])  Major depressive disorder, recurrent episode, moderate ([place])  Plan of Care: Asked pt to try to manage his alcohol use as much as possible between now and our follow up  session in the office on 08/14/22.  Also asked pt to think about what he wants his conversation with [name] to look like when he ends up having that talk.     [name], [facility]

## 2022-08-02 ENCOUNTER — Encounter: Payer: 59 | Admitting: Rehabilitative and Restorative Service Providers"

## 2022-08-14 ENCOUNTER — Ambulatory Visit: Payer: 59 | Admitting: Psychology

## 2022-08-14 DIAGNOSIS — F1020 Alcohol dependence, uncomplicated: Secondary | ICD-10-CM

## 2022-08-14 DIAGNOSIS — F331 Major depressive disorder, recurrent, moderate: Secondary | ICD-10-CM | POA: Diagnosis not present

## 2022-08-14 NOTE — Progress Notes (Signed)
[place] Counselor/Therapist Progress Note  Patient ID: Matthew Mckinney, MRN: [id number],    Date: 08/14/2022  Time Spent: 45 mins  Treatment Type: Individual Therapy  Reported Symptoms: Pt presents in person for this session, granting consent for the session.  Mental Status Exam: Appearance:  Casual     Behavior: Appropriate  Motor: Normal  Speech/Language:  Clear and Coherent  Affect: Appropriate  Mood: depressed  Thought process: normal  Thought content:   WNL  Sensory/Perceptual disturbances:   WNL  Orientation: oriented to person, place, and time/date  Attention: Good  Concentration: Good  Memory: WNL  Fund of knowledge:  Good  Insight:   Good  Judgment:  Good  Impulse Control: Good   Risk Assessment: Danger to Self:  No Self-injurious Behavior: No Danger to Others: No Duty to Warn:no Physical Aggression / Violence:No  Access to Firearms a concern: Yes ; pt agreed last session to give the keys to the safe to [name], but he has not done it yet.  He agrees to do it between now and next session. Gang Involvement:No   Subjective: Pt shares that he has not yet given the keys to the gun safe to [name], "because I am not ready to say to [name] that I need to give the keys to her.  I will try to do it before our next session."  Pt shares he continues to drink daily "except for yesterday.  I am going to try to reign it in at some point.  I want to feel better.  I am thinking about not drinking until Thanksgiving."  Pt shares that he slept better last night than he had in a while.  Pt shares that he is aware that he has a problem with alcohol; "I know I drink too much; I wish I could use alcohol responsibly, but I can't.  I am not willing to say to anyone, other than [name], that I have a problem with it."  Pt shares he did think about his conversation that needs to happen with [name]; "I am not ready to do that yet; the wound has to heal some more first."  Pt  shares that [name] is supportive of him talking with her at some point.  We talked about why it is necessary to circle back with [name] and have that sort of conversation.  He has been trying to be intentional about having one on one time with [name] and Evy.  Pt shares he has not been doing much for his self care since our last session; he "used to play video games until I become enraged.  I stopped playing for a while and now I just play for a while and now quit before I get mad."  H e took yesterday off and enjoyed being productive around the house.  He is thinking about running again.  He is trying to plan to work out with weights again.  He has been playing guitar some again, but not much.  Encouraged pt to continue to think about his self care activities and to engage with them as regularly as he can.  Pt shares he will call the office to schedule his follow up session as he does not have his work phone which has his work schedule on it.  Interventions: Cognitive Behavioral Therapy  Diagnosis:Alcohol use disorder, moderate, in controlled environment, dependence ([facility])  Major depressive disorder, recurrent episode, moderate ([place])  Plan: Treatment Plan Strengths/Abilities:  Intelligent, Intuitive, Willing to participate  in therapy Treatment Preferences:  Outpatient Individual Therapy Statement of Needs:  Patient is to use CBT, mindfulness and coping skills to help manage and/or decrease symptoms associated with their diagnosis. Symptoms:  Depressed/Irritable mood, worry, social withdrawal Problems Addressed:  Depressive thoughts, Sadness, Sleep issues, etc. Long Term Goals:  Pt to reduce overall level, frequency, and intensity of the feelings of depression as evidenced by decreased irritability, negative self talk, and helpless feelings from 6 to 7 days/week to 0 to 1 days/week, per client report, for at least 3 consecutive months.  Progress: 10% Short Term Goals:  Pt to verbally express understanding  of the relationship between feelings of depression and their impact on thinking patterns and behaviors.  Pt to verbalize an understanding of the role that distorted thinking plays in creating fears, excessive worry, and ruminations.  Progress: 10% Target Date:  08/15/2023 Frequency:  Bi-weekly Modality:  Cognitive Behavioral Therapy Interventions by Therapist:  Therapist will use CBT, Mindfulness exercises, Coping skills and Referrals, as needed by client. Client has verbally approved this treatment plan.  Karie Kirks, [facility]

## 2022-09-05 ENCOUNTER — Ambulatory Visit (INDEPENDENT_AMBULATORY_CARE_PROVIDER_SITE_OTHER): Payer: 59 | Admitting: Psychology

## 2022-09-05 DIAGNOSIS — F1020 Alcohol dependence, uncomplicated: Secondary | ICD-10-CM | POA: Diagnosis not present

## 2022-09-05 DIAGNOSIS — F331 Major depressive disorder, recurrent, moderate: Secondary | ICD-10-CM | POA: Diagnosis not present

## 2022-09-05 NOTE — Progress Notes (Signed)
[place] Counselor/Therapist Progress Note  Patient ID: Matthew Mckinney, MRN: [id number],    Date: 09/05/2022  Time Spent: 60 mins  Treatment Type: Individual Therapy  Reported Symptoms: Pt presents via WebEx video for this session.  Pt grants  consent for the session, stating he is in his home with no one else present.  I shared with pt that I am in my office with no one else here either.  Mental Status Exam: Appearance:  Casual     Behavior: Appropriate  Motor: Normal  Speech/Language:  Clear and Coherent  Affect: Appropriate  Mood: depressed  Thought process: normal  Thought content:   WNL  Sensory/Perceptual disturbances:   WNL  Orientation: oriented to person, place, and time/date  Attention: Good  Concentration: Good  Memory: WNL  Fund of knowledge:  Good  Insight:   Good  Judgment:  Good  Impulse Control: Good   Risk Assessment: Danger to Self:  No Self-injurious Behavior: No Danger to Others: No Duty to Warn:no Physical Aggression / Violence:No  Access to Firearms a concern: Yes ; pt agreed last session to give the keys to the safe to [name], but he has not done it yet.  He agrees to do it between now and next session. Gang Involvement:No   Subjective: Pt shares that he "has been OK.  My mother-in-law Edrick Oh, [place]) had a stroke and is now back home and is recovering, after having gone to rehab for a couple of weeks.  My sister also got sick on Thanksgiving day and had to be flown from [facility] to [facility]; she was bleeding internally.  She is out of ICU but is still in [facility]."  Pt shares that they learned that Lucita Ferrara mom is also diabetic but she had not told the family.  Pt shares that his drinking has been better; he has been not drinking during the week, but he has been drinking on the weekends.  He has been talking about his drinking with [name]; he has told her that he does not want to continue bringing alcohol home.  He is able to  acknowledge that, if beer is present, he will drink it.  Pt shares that he has not been able to engage in many self care activities except for playing video games and playing guitar some.  He would like to begin working out in his garage and possibly walking to run.  Pt shares he is realizing that he tends to get angry when he plays video games; he has noted that Milton (his dog) reacts when she perceives him as being angry.  He is aware that his family and dog reacts more to his anger than he does and that causes pt to realize that their perception of his anger is important to him and to them as well.  It is also upsetting to pt that he has think about himself as an angry, frustrated, drunk person.  Pt shares that he is struggling with seeing good things that he can pursue; "I get bored easily; I can't play guitar for more than 30 mins, I can't play video games for more than an hour at a time."  Pt shares that he just had his annual review and his boss gave him the highest praise he could receive and his boss told him he would give him the maximum amount of a pay increase that he could.  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  Pt is not excited about getting the certification and is putting it off on purpose.  Pt shares he continues to spend time with his daughters, especially with [name], and he enjoys that time.  He shares he did have a short conversation with [name] about his blow up with her; he tears up when talking about it.  He believes that [name] perceived the conversation as an apology.  Asked pt to continue to think about where his emotion comes from around this issue.  He shares that "I hurt them and I don't want to do that."  Asked pt to continue to think about his emotion around this situation and we  will talk more about it in our next session.  Also asked him to continue thinking and planning about his drinking behavior. Encouraged pt to continue to think about his self care activities and to engage with them as regularly as he can and we will meet in 2 wks for a follow up session.   Interventions: Cognitive Behavioral Therapy  Diagnosis:Alcohol use disorder, moderate, in controlled environment, dependence (HCC)  Major depressive disorder, recurrent episode, moderate (HCC)  Plan: Treatment Plan Strengths/Abilities:  Intelligent, Intuitive, Willing to participate in therapy Treatment Preferences:  Outpatient Individual Therapy Statement of Needs:  Patient is to use CBT, mindfulness and coping skills to help manage and/or decrease symptoms associated with their diagnosis. Symptoms:  Depressed/Irritable mood, worry, social withdrawal Problems Addressed:  Depressive thoughts, Sadness, Sleep issues, etc. Long Term Goals:  Pt to reduce overall level, frequency, and intensity of the feelings of depression as evidenced by decreased irritability, negative self talk, and helpless feelings from 6 to 7 days/week to 0 to 1 days/week, per client report, for at least 3 consecutive months.  Progress: 10% Short Term Goals:  Pt to verbally express understanding of the relationship between feelings of depression and their impact on thinking patterns and behaviors.  Pt to verbalize an understanding of the role that distorted thinking plays in creating fears, excessive worry, and ruminations.  Progress: 10% Target Date:  08/15/2023 Frequency:  Bi-weekly Modality:  Cognitive Behavioral Therapy Interventions by Therapist:  Therapist will use CBT, Mindfulness exercises, Coping skills and Referrals, as needed by client. Client has verbally approved this treatment plan.  Karie Kirks, [facility]

## 2022-09-19 ENCOUNTER — Ambulatory Visit (INDEPENDENT_AMBULATORY_CARE_PROVIDER_SITE_OTHER): Payer: 59 | Admitting: Psychology

## 2022-09-19 DIAGNOSIS — F1020 Alcohol dependence, uncomplicated: Secondary | ICD-10-CM | POA: Diagnosis not present

## 2022-09-19 DIAGNOSIS — F331 Major depressive disorder, recurrent, moderate: Secondary | ICD-10-CM
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
## 2022-10-11 ENCOUNTER — Ambulatory Visit (INDEPENDENT_AMBULATORY_CARE_PROVIDER_SITE_OTHER): Payer: 59 | Admitting: Psychology

## 2022-10-11 DIAGNOSIS — F331 Major depressive disorder, recurrent, moderate: Secondary | ICD-10-CM

## 2022-10-11 DIAGNOSIS — F1020 Alcohol dependence, uncomplicated: Secondary | ICD-10-CM | POA: Diagnosis not present

## 2022-10-11 NOTE — Progress Notes (Signed)
[facility] Counselor/Therapist Progress Note  Patient ID: Matthew Mckinney, MRN: [id number],    Date: 10/11/2022  Time Spent: 45 mins  Treatment Type: Individual Therapy  Reported Symptoms: Pt presents via WebEx video for this session.  Pt grants  consent for the session, stating he is in his home with no one else present.  I shared with pt that I am in my office with no one else here either.  Mental Status Exam: Appearance:  Casual     Behavior: Appropriate  Motor: Normal  Speech/Language:  Clear and Coherent  Affect: Appropriate  Mood: depressed  Thought process: normal  Thought content:   WNL  Sensory/Perceptual disturbances:   WNL  Orientation: oriented to person, place, and time/date  Attention: Good  Concentration: Good  Memory: WNL  Fund of knowledge:  Good  Insight:   Good  Judgment:  Good  Impulse Control: Good   Risk Assessment: Danger to Self:  No Self-injurious Behavior: No Danger to Others: No Duty to Warn:no Physical Aggression / Violence:No  Access to Firearms a concern: Yes ; pt agreed last session to give the keys to the safe to [name], but he has not done it yet.  He agrees to do it between now and next session. Gang Involvement:No   Subjective: Pt shares that he "had a great time with family over the holidays.  We watched a lot of movies and I played some video games.  I had a five day weekend off from work for Christmas and a three day weekend for New Years."  Pt shares that the girls enjoyed their time away from school.  Wished pt a happy birthday for yesterday.  Pt shares that he and [name] and doing well.  He shares that his drinking has been restricted to the weekends since our last session, except for the Christmas break.  He drank daily for the Christmas holiday; he shares he drank about 6 six packs over the Christmas holiday and 2 twelve packs over the New Years weekend.  Pt shares that [name] is supportive of him only drinking on the  weekends.  Pt shares that he did not sleep as well over the two holiday weekends because of his increased drinking and he became aware of that and wants to be able to sleep better.  Pt shares that his sister had to go back to the hospital for a day recently but is continuing to recover.  Pt dropped off of his exercise program during the holidays and due to the rain so he hopes to continue running during the days.  Pt shares that he and his friends played a show recently and he enjoyed the event.  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  Performing is stressful for pt but practicing is fun for him.  [name] is planning to go to [place] to help her sister when her sister has her second child in Feb.  Pt shares that work slows down a lot during the holidays and that has been good for pt.  They are interviewing new team members and pt is part of that process and he appreciates that opportunity.  Pt shares that he has not had any anger outbursts since our last session.  He had an experience of frustration with the girls ([name] and Karlene Einstein) but was pleased with how he interacted with them.  He was even able to congratulate himself on his performance after the event.  Pt has been playing  more video games, probably later into the night than he needs to so he will try to be more aware of that moving forward.  Encouraged pt to continue to think about his self care activities and to engage with them as regularly as he can and we will meet in 3 wks for a follow up session.   Interventions: Cognitive Behavioral Therapy  Diagnosis:Alcohol use disorder, moderate, in controlled environment, dependence (HCC)  Major depressive disorder, recurrent episode, moderate ([place])  Plan: Treatment Plan Strengths/Abilities:  Intelligent, Intuitive, Willing to participate in therapy Treatment Preferences:  Outpatient Individual Therapy Statement of Needs:  Patient is to use CBT, mindfulness and coping skills to help manage and/or  decrease symptoms associated with their diagnosis. Symptoms:  Depressed/Irritable mood, worry, social withdrawal Problems Addressed:  Depressive thoughts, Sadness, Sleep issues, etc. Long Term Goals:  Pt to reduce overall level, frequency, and intensity of the feelings of depression as evidenced by decreased irritability, negative self talk, and helpless feelings from 6 to 7 days/week to 0 to 1 days/week, per client report, for at least 3 consecutive months.  Progress: 10% Short Term Goals:  Pt to verbally express understanding of the relationship between feelings of depression and their impact on thinking patterns and behaviors.  Pt to verbalize an understanding of the role that distorted thinking plays in creating fears, excessive worry, and ruminations.  Progress: 10% Target Date:  08/15/2023 Frequency:  Bi-weekly Modality:  Cognitive Behavioral Therapy Interventions by Therapist:  Therapist will use CBT, Mindfulness exercises, Coping skills and Referrals, as needed by client. Client has verbally approved this treatment plan.  [name], [facility]

## 2022-10-31 ENCOUNTER — Ambulatory Visit (INDEPENDENT_AMBULATORY_CARE_PROVIDER_SITE_OTHER): Payer: 59 | Admitting: Psychology

## 2022-10-31 DIAGNOSIS — F1020 Alcohol dependence, uncomplicated: Secondary | ICD-10-CM

## 2022-10-31 DIAGNOSIS — F331 Major depressive disorder, recurrent, moderate: Secondary | ICD-10-CM

## 2022-10-31 NOTE — Progress Notes (Signed)
[facility] Counselor/Therapist Progress Note  Patient ID: Matthew Mckinney, MRN: [id number],    Date: 10/31/2022  Time Spent: 45 mins  Treatment Type: Individual Therapy  Reported Symptoms: Pt presents via WebEx video for this session.  Pt grants  consent for the session, stating he is in his home with no one else present.  I shared with pt that I am in my office with no one else here either.  Mental Status Exam: Appearance:  Casual     Behavior: Appropriate  Motor: Normal  Speech/Language:  Clear and Coherent  Affect: Appropriate  Mood: depressed  Thought process: normal  Thought content:   WNL  Sensory/Perceptual disturbances:   WNL  Orientation: oriented to person, place, and time/date  Attention: Good  Concentration: Good  Memory: WNL  Fund of knowledge:  Good  Insight:   Good  Judgment:  Good  Impulse Control: Good   Risk Assessment: Danger to Self:  No Self-injurious Behavior: No Danger to Others: No Duty to Warn:no Physical Aggression / Violence:No  Access to Firearms a concern: Yes ; pt agreed last session to give the keys to the safe to [name], but he has not done it yet.  He agrees to do it between now and next session. Gang Involvement:No   Subjective: Pt shares that he "have been mostly fine but incredibly bored.  I have realized that boredom drives a lot of the negative things in my life (drinking and anger).  I realize that I am making my own choices and that I am not doing what I want to do and that is on me."  Pt shares that his distraction causes him to drink more, play video games, or scrolling on his phone.  He has not wanted to do any of the projects he has identified as possibly interesting to him.  He has a work test he has to pass by sometime this summer and that could be a distraction for him.  Talked about ways to get started again on this project and encouraged pt to try to re-engage in this process.  Also talked with pt about being  more engaged with other people in his life personally as a means for getting out of his period of boredom.  Pt shares he has not been meeting his drinking goals since our last session.  He has been drinking more than he wanted to/planned to.  Encouraged pt to continue to think about his self care activities and to engage with them as regularly as he can and we will meet in 2 wks for a follow up session.   Interventions: Cognitive Behavioral Therapy  Diagnosis:Alcohol use disorder, moderate, in controlled environment, dependence (HCC)  Major depressive disorder, recurrent episode, moderate ([place])  Plan: Treatment Plan Strengths/Abilities:  Intelligent, Intuitive, Willing to participate in therapy Treatment Preferences:  Outpatient Individual Therapy Statement of Needs:  Patient is to use CBT, mindfulness and coping skills to help manage and/or decrease symptoms associated with their diagnosis. Symptoms:  Depressed/Irritable mood, worry, social withdrawal Problems Addressed:  Depressive thoughts, Sadness, Sleep issues, etc. Long Term Goals:  Pt to reduce overall level, frequency, and intensity of the feelings of depression as evidenced by decreased irritability, negative self talk, and helpless feelings from 6 to 7 days/week to 0 to 1 days/week, per client report, for at least 3 consecutive months.  Progress: 10% Short Term Goals:  Pt to verbally express understanding of the relationship between feelings of depression and their impact on  thinking patterns and behaviors.  Pt to verbalize an understanding of the role that distorted thinking plays in creating fears, excessive worry, and ruminations.  Progress: 10% Target Date:  08/15/2023 Frequency:  Bi-weekly Modality:  Cognitive Behavioral Therapy Interventions by Therapist:  Therapist will use CBT, Mindfulness exercises, Coping skills and Referrals, as needed by client. Client has verbally approved this treatment plan.  [name], [facility]

## 2022-11-14 ENCOUNTER — Ambulatory Visit (INDEPENDENT_AMBULATORY_CARE_PROVIDER_SITE_OTHER): Payer: 59 | Admitting: Psychology

## 2022-11-14 DIAGNOSIS — F331 Major depressive disorder, recurrent, moderate: Secondary | ICD-10-CM

## 2022-11-14 DIAGNOSIS — F1020 Alcohol dependence, uncomplicated: Secondary | ICD-10-CM

## 2022-11-14 NOTE — Progress Notes (Signed)
[place] Counselor/Therapist Progress Note  Patient ID: Matthew Mckinney, MRN: [id number],    Date: 11/14/2022  Time Spent: 45 mins  Treatment Type: Individual Therapy  Reported Symptoms: Pt presents via WebEx video for this session.  Pt grants  consent for the session, stating he is in his home with no one else present.  I shared with pt that I am in my office with no one else here either.  Mental Status Exam: Appearance:  Casual     Behavior: Appropriate  Motor: Normal  Speech/Language:  Clear and Coherent  Affect: Appropriate  Mood: depressed  Thought process: normal  Thought content:   WNL  Sensory/Perceptual disturbances:   WNL  Orientation: oriented to person, place, and time/date  Attention: Good  Concentration: Good  Memory: WNL  Fund of knowledge:  Good  Insight:   Good  Judgment:  Good  Impulse Control: Good   Risk Assessment: Danger to Self:  No Self-injurious Behavior: No Danger to Others: No Duty to Warn:no Physical Aggression / Violence:No  Access to Firearms a concern: Yes ; pt agreed last session to give the keys to the safe to [name], but he has not done it yet.  He agrees to do it between now and next session. Gang Involvement:No   Subjective: Pt shares that he "have been mostly fine since our last session.  I am doing better with the drinking; keeping it controlled during the week.  I have been focusing on little projects around the home that have helped me with my boredom."  He is looking for things to fill the boredom.  Pt shares that he "beat myself up when things don't go the way I want them to go."  We talked about the "beating myself up process."  Pt shares that he has been doing that for a number of years; he tends to do that when he realizes that he is not doing what he knows he needs to do.  Talked with pt about the processes of deep breathing, stretching, and meditation as a means of helping him replace some of his alcohol use  with more healthy alternatives.  Pt expresses an interest in learning more about these options and will explore the internet for options.  Pt shares that it is birthday season for their family; their oldest daughter's Corky Sing) birthday is this Sunday; Evie's birthday is coming up soon as well.  [name] is going out of town on Saturday; she is going to [place] for 10 days to help her sister who will be having her second child on 2/13.  Pt shares that he has been working on his certification for work and is making progress on that task; he feels good about his progress at this point.  Encouraged pt to continue to think about his self care activities and to engage with them as regularly as he can and we will meet in 2 wks for a follow up session.   Interventions: Cognitive Behavioral Therapy  Diagnosis:Alcohol use disorder, moderate, in controlled environment, dependence (HCC)  Major depressive disorder, recurrent episode, moderate ([place])  Plan: Treatment Plan Strengths/Abilities:  Intelligent, Intuitive, Willing to participate in therapy Treatment Preferences:  Outpatient Individual Therapy Statement of Needs:  Patient is to use CBT, mindfulness and coping skills to help manage and/or decrease symptoms associated with their diagnosis. Symptoms:  Depressed/Irritable mood, worry, social withdrawal Problems Addressed:  Depressive thoughts, Sadness, Sleep issues, etc. Long Term Goals:  Pt to reduce overall level, frequency, and  intensity of the feelings of depression as evidenced by decreased irritability, negative self talk, and helpless feelings from 6 to 7 days/week to 0 to 1 days/week, per client report, for at least 3 consecutive months.  Progress: 10% Short Term Goals:  Pt to verbally express understanding of the relationship between feelings of depression and their impact on thinking patterns and behaviors.  Pt to verbalize an understanding of the role that distorted thinking plays in creating fears,  excessive worry, and ruminations.  Progress: 10% Target Date:  08/15/2023 Frequency:  Bi-weekly Modality:  Cognitive Behavioral Therapy Interventions by Therapist:  Therapist will use CBT, Mindfulness exercises, Coping skills and Referrals, as needed by client. Client has verbally approved this treatment plan.  [name], [facility]

## 2022-11-28 ENCOUNTER — Ambulatory Visit (INDEPENDENT_AMBULATORY_CARE_PROVIDER_SITE_OTHER): Payer: 59 | Admitting: Psychology

## 2022-11-28 DIAGNOSIS — F331 Major depressive disorder, recurrent, moderate: Secondary | ICD-10-CM | POA: Diagnosis not present

## 2022-11-28 DIAGNOSIS — F1020 Alcohol dependence, uncomplicated: Secondary | ICD-10-CM | POA: Diagnosis not present

## 2022-11-28 NOTE — Progress Notes (Signed)
[place] Counselor/Therapist Progress Note  Patient ID: Matthew Mckinney, MRN: [id number],    Date: 11/28/2022  Time Spent: 45 mins  Treatment Type: Individual Therapy  Reported Symptoms: Pt presents via WebEx video for this session.  Pt grants  consent for the session, stating he is in his home with no one else present.  I shared with pt that I am in my office with no one else here either.  Mental Status Exam: Appearance:  Casual     Behavior: Appropriate  Motor: Normal  Speech/Language:  Clear and Coherent  Affect: Appropriate  Mood: depressed  Thought process: normal  Thought content:   WNL  Sensory/Perceptual disturbances:   WNL  Orientation: oriented to person, place, and time/date  Attention: Good  Concentration: Good  Memory: WNL  Fund of knowledge:  Good  Insight:   Good  Judgment:  Good  Impulse Control: Good   Risk Assessment: Danger to Self:  No Self-injurious Behavior: No Danger to Others: No Duty to Warn:no Physical Aggression / Violence:No  Access to Firearms a concern: Yes ; pt agreed last session to give the keys to the safe to Matthew Mckinney, but he has not done it yet.  He agrees to do it between now and next session. Gang Involvement:No   Subjective: Pt shares that he "had a flying success for the time Matthew Mckinney was gone.  We had both girls birthdays while Matthew Mckinney was gone.  Matthew Mckinney was a little rough on Matthew Mckinney's birthday because it was not all about her that day and she can be that way.  I did drink more while she was gone than I wanted to but that was one me and not her.  We had no real problems while she was gone.  I also bought a car while she was gone; we had been looking for a car for Matthew Mckinney for a year or so."  Pt shares that he does not feel guilty for drinking the way he is currently drinking; he is not getting black out drunk and is OK with what he is doing now.  Pt shares that his parents are doing well.  Pt shares that "work has been really  annoying in the last week.  I have been looking for jobs because I feel like I want to walk out of this job.  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  My boss' boss signed me up for a new program that is 20 weeks long and requires weekly assignments."  Pt shares that he has been sleeping well since our last session.  Pt did look into deep breathing and meditation since our last session; encouraged him to be intentional about continuing to explore these options for his emotional benefit.  Encouraged pt to continue to think about his self care activities and to engage with them as regularly as he can and we will meet in 2 wks for a follow up session.   Interventions: Cognitive Behavioral Therapy  Diagnosis:Alcohol use disorder, moderate, in controlled environment, dependence (HCC)  Major depressive disorder, recurrent episode, moderate ([place])  Plan: Treatment Plan Strengths/Abilities:  Intelligent, Intuitive, Willing to participate in therapy Treatment Preferences:  Outpatient Individual Therapy Statement of Needs:  Patient is to use CBT, mindfulness  and coping skills to help manage and/or decrease symptoms associated with their diagnosis. Symptoms:  Depressed/Irritable mood, worry, social withdrawal Problems Addressed:  Depressive thoughts, Sadness, Sleep issues, etc. Long Term Goals:  Pt to reduce overall level, frequency, and intensity of the feelings of depression as evidenced by decreased irritability, negative self talk, and helpless feelings from 6 to 7 days/week to 0 to 1 days/week, per client report, for at least 3 consecutive months.  Progress: 10% Short Term Goals:  Pt to verbally express understanding of the relationship between feelings of depression and their impact on thinking patterns and  behaviors.  Pt to verbalize an understanding of the role that distorted thinking plays in creating fears, excessive worry, and ruminations.  Progress: 10% Target Date:  08/15/2023 Frequency:  Bi-weekly Modality:  Cognitive Behavioral Therapy Interventions by Therapist:  Therapist will use CBT, Mindfulness exercises, Coping skills and Referrals, as needed by client. Client has verbally approved this treatment plan.  [name], [facility]

## 2022-12-12 ENCOUNTER — Ambulatory Visit (INDEPENDENT_AMBULATORY_CARE_PROVIDER_SITE_OTHER): Payer: 59 | Admitting: Psychology

## 2022-12-12 DIAGNOSIS — F1020 Alcohol dependence, uncomplicated: Secondary | ICD-10-CM | POA: Diagnosis not present

## 2022-12-12 DIAGNOSIS — F331 Major depressive disorder, recurrent, moderate: Secondary | ICD-10-CM | POA: Diagnosis not present

## 2022-12-12 NOTE — Progress Notes (Signed)
[place] Counselor/Therapist Progress Note  Patient ID: Matthew Mckinney, MRN: [id number],    Date: 12/12/2022  Time Spent: 45 mins  Treatment Type: Individual Therapy  Reported Symptoms: Pt presents via WebEx video for this session.  Pt grants  consent for the session, stating he is in his home with no one else present.  I shared with pt that I am in my office with no one else here either.  Mental Status Exam: Appearance:  Casual     Behavior: Appropriate  Motor: Normal  Speech/Language:  Clear and Coherent  Affect: Appropriate  Mood: depressed  Thought process: normal  Thought content:   WNL  Sensory/Perceptual disturbances:   WNL  Orientation: oriented to person, place, and time/date  Attention: Good  Concentration: Good  Memory: WNL  Fund of knowledge:  Good  Insight:   Good  Judgment:  Good  Impulse Control: Good   Risk Assessment: Danger to Self:  No Self-injurious Behavior: No Danger to Others: No Duty to Warn:no Physical Aggression / Violence:No  Access to Firearms a concern: Yes ; pt agreed last session to give the keys to the safe to Matthew Mckinney, but he has not done it yet.  He agrees to do it between now and next session. Gang Involvement:No   Subjective: Pt shares that he "has been basically the same.  My plan for reduced drinking is not as reduced as I would like.  My boredom is at an all time high.  I can't find some activity that I want to do."  Pt shares he has not been able to focus on things that might me good for him.  Pt talks about "getting pissed off at not being able to get better at things or do more things that are good for me or the family."  Talked with pt about how anger operates in his life; "I think anger does drive a lot of feelings of worthlessness for me."  Talked with pt at length about the role that anger plays in his life.  Pt talks about the fact that drinking helps tamp down his anger.  Pt shares his "dad is similar to me  with his anger; he is the maddest nice person I know."  Pt shares that it is easy for him to manage his emotions in public.  Pt shares that he has a physical scheduled for next week; he is treated for gout and colitis.  His gout is well controlled with medication and his colitis is currently in remission at this point.  Pt shares that Matthew Mckinney is enjoying her new car.  Pt shares the girls are good; "nothing new to report there.  [employer] cookie season is over and that is good for everyone."  Pt did look into deep breathing and meditation since our last session; encouraged him to be intentional about continuing to explore these options for his emotional benefit.  Encouraged pt to continue to think about his self care activities and to engage with them as regularly as he can and we will meet in 2 wks for a follow up session.   Interventions: Cognitive Behavioral Therapy  Diagnosis:Alcohol use disorder, moderate, in controlled environment, dependence (HCC)  Major depressive disorder, recurrent episode, moderate ([place])  Plan: Treatment Plan Strengths/Abilities:  Intelligent, Intuitive, Willing to participate in therapy Treatment Preferences:  Outpatient Individual Therapy Statement of Needs:  Patient is to use CBT, mindfulness and coping skills to help manage and/or decrease symptoms associated with their diagnosis.  Symptoms:  Depressed/Irritable mood, worry, social withdrawal Problems Addressed:  Depressive thoughts, Sadness, Sleep issues, etc. Long Term Goals:  Pt to reduce overall level, frequency, and intensity of the feelings of depression as evidenced by decreased irritability, negative self talk, and helpless feelings from 6 to 7 days/week to 0 to 1 days/week, per client report, for at least 3 consecutive months.  Progress: 10% Short Term Goals:  Pt to verbally express understanding of the relationship between feelings of depression and their impact on thinking patterns and behaviors.  Pt to  verbalize an understanding of the role that distorted thinking plays in creating fears, excessive worry, and ruminations.  Progress: 10% Target Date:  08/15/2023 Frequency:  Bi-weekly Modality:  Cognitive Behavioral Therapy Interventions by Therapist:  Therapist will use CBT, Mindfulness exercises, Coping skills and Referrals, as needed by client. Client has verbally approved this treatment plan.  [name], [facility]

## 2022-12-26 ENCOUNTER — Ambulatory Visit (INDEPENDENT_AMBULATORY_CARE_PROVIDER_SITE_OTHER): Payer: 59 | Admitting: Psychology

## 2022-12-26 DIAGNOSIS — F1020 Alcohol dependence, uncomplicated: Secondary | ICD-10-CM

## 2022-12-26 DIAGNOSIS — F331 Major depressive disorder, recurrent, moderate: Secondary | ICD-10-CM | POA: Diagnosis not present

## 2022-12-26 NOTE — Progress Notes (Signed)
[place] Counselor/Therapist Progress Note  Patient ID: Matthew Mckinney, MRN: [id number],    Date: 12/26/2022  Time Spent: 60 mins  Treatment Type: Individual Therapy  Reported Symptoms: Pt presents via WebEx video for this session.  Pt grants  consent for the session, stating he is in his home with no one else present.  I shared with pt that I am in my office with no one else here either.  Mental Status Exam: Appearance:  Casual     Behavior: Appropriate  Motor: Normal  Speech/Language:  Clear and Coherent  Affect: Appropriate  Mood: depressed  Thought process: normal  Thought content:   WNL  Sensory/Perceptual disturbances:   WNL  Orientation: oriented to person, place, and time/date  Attention: Good  Concentration: Good  Memory: WNL  Fund of knowledge:  Good  Insight:   Good  Judgment:  Good  Impulse Control: Good   Risk Assessment: Danger to Self:  No Self-injurious Behavior: No Danger to Others: No Duty to Warn:no Physical Aggression / Violence:No  Access to Firearms a concern: Yes ; pt agreed last session to give the keys to the safe to [name], but he has not done it yet.  He agrees to do it between now and next session. Gang Involvement:No   Subjective: Pt shares that he "has been basically the same; not in a bad way.  I have been able to get outside some that that was good for me, even if that was to mow the yard.  I have been drinking more than I want to and more than I plan.  Work has been very busy and they have asked me to apply for a new position there and I am still trying to decide what to do."  Pt shares that the new job has to do with a new team with [employer].  He is considering the pros and cons.  Pt states he is using alcohol to manage his stress of thinking about changing roles.  Pt sees this opportunity as a validation of his work and efforts at work; he does view it as his mgmt recognizing his capabilities.  He shares  that he likes to bring others along with him as he moves through his career and be feels like others recognize that as well.  He is planning to apply for the position but is doing some study and reading on the new job responsibilities and aim his resume at the new position.  Pt shares that this new opportunity increases pressure for him and he feels the anger with him growing; he has been careful not to express his anger in front of others, especially the girls.  He shares that he vents with [name] and his managers at work.  He has also been exercising more which helps him get some of his emotions out.  Talked with pts about his drinking being more than he wants to; "I am getting a 12 pack every other day now and it is wasteful and not helpful for me.  Pt shares that [name] has suggested that he get a sleep study because he has erratic sleep times; he is aware that alcohol does not help him sleep better.  Asked pt to think about how he might feel about seeking treatment for alcohol abuse.  Using alcohol does not appear to be congruent with him making other positive choices in his life and I asked pt to think about this topic for our next  session.  Encouraged pt to continue to think about his self care activities and to engage with them as regularly as he can and we will meet in 2 wks for a follow up session.   Interventions: Cognitive Behavioral Therapy  Diagnosis:Alcohol use disorder, moderate, in controlled environment, dependence (HCC)  Major depressive disorder, recurrent episode, moderate ([place])  Plan: Treatment Plan Strengths/Abilities:  Intelligent, Intuitive, Willing to participate in therapy Treatment Preferences:  Outpatient Individual Therapy Statement of Needs:  Patient is to use CBT, mindfulness and coping skills to help manage and/or decrease symptoms associated with their diagnosis. Symptoms:  Depressed/Irritable mood, worry, social withdrawal Problems Addressed:  Depressive thoughts,  Sadness, Sleep issues, etc. Long Term Goals:  Pt to reduce overall level, frequency, and intensity of the feelings of depression as evidenced by decreased irritability, negative self talk, and helpless feelings from 6 to 7 days/week to 0 to 1 days/week, per client report, for at least 3 consecutive months.  Progress: 10% Short Term Goals:  Pt to verbally express understanding of the relationship between feelings of depression and their impact on thinking patterns and behaviors.  Pt to verbalize an understanding of the role that distorted thinking plays in creating fears, excessive worry, and ruminations.  Progress: 10% Target Date:  08/15/2023 Frequency:  Bi-weekly Modality:  Cognitive Behavioral Therapy Interventions by Therapist:  Therapist will use CBT, Mindfulness exercises, Coping skills and Referrals, as needed by client. Client has verbally approved this treatment plan.  [name], [facility]

## 2023-01-09 ENCOUNTER — Ambulatory Visit (INDEPENDENT_AMBULATORY_CARE_PROVIDER_SITE_OTHER): Payer: 59 | Admitting: Psychology

## 2023-01-09 DIAGNOSIS — F1020 Alcohol dependence, uncomplicated: Secondary | ICD-10-CM | POA: Diagnosis not present

## 2023-01-09 DIAGNOSIS — F331 Major depressive disorder, recurrent, moderate: Secondary | ICD-10-CM | POA: Diagnosis not present

## 2023-01-09 NOTE — Progress Notes (Signed)
[place] Counselor/Therapist Progress Note  Patient ID: Matthew Mckinney, MRN: [id number],    Date: 01/09/2023  Time Spent: 45 mins  Treatment Type: Individual Therapy  Reported Symptoms: Pt presents via WebEx video for this session.  Pt grants  consent for the session, stating he is in his home with no one else present.  I shared with pt that I am in my office with no one else here either.  Mental Status Exam: Appearance:  Casual     Behavior: Appropriate  Motor: Normal  Speech/Language:  Clear and Coherent  Affect: Appropriate  Mood: depressed  Thought process: normal  Thought content:   WNL  Sensory/Perceptual disturbances:   WNL  Orientation: oriented to person, place, and time/date  Attention: Good  Concentration: Good  Memory: WNL  Fund of knowledge:  Good  Insight:   Good  Judgment:  Good  Impulse Control: Good   Risk Assessment: Danger to Self:  No Self-injurious Behavior: No Danger to Others: No Duty to Warn:no Physical Aggression / Violence:No  Access to Firearms a concern: Yes ; pt agreed last session to give the keys to the safe to [name], but he has not done it yet.  He agrees to do it between now and next session. Gang Involvement:No   Subjective: Pt shares that "it is going OK.  We had to take [name] (42 yo) to the doctor recently and they did an x-ray and it turns out she has scoliosis and will require surgery.  They have seen a pediatric ortho doctor and he has ordered an MRI for 4/16 and will see her back again for an update.  Pt shares he "is anxious and nervous" about this situation.  He has been drinking as a means to deal with the stress; talked with pt about better ways to manage his stress.  He has told his boss about it yesterday and his boss was very supportive.  Pt also shares he has been running some to help him deal with this stress.  Talked with pt about his worries about this diagnosis for [facility].  Pt shares that he has  not applied for the new job yet but he is getting ready to do so.  He feels like he will be the top candidate for the new position.  Talked with pt again about his use of alcohol as a coping skill; he knows it is not effective for him and he knows how if harms him.  Mentioned the possibility of researching alcohol treatment programs like [employer].  Given what is going on with [facility], he may not be able to do so.  Encouraged pt to continue to think about his self care activities and to engage with them as regularly as he can and we will meet in 2 wks for a follow up session.   Interventions: Cognitive Behavioral Therapy  Diagnosis:Alcohol use disorder, moderate, in controlled environment, dependence  Major depressive disorder, recurrent episode, moderate  Plan: Treatment Plan Strengths/Abilities:  Intelligent, Intuitive, Willing to participate in therapy Treatment Preferences:  Outpatient Individual Therapy Statement of Needs:  Patient is to use CBT, mindfulness and coping skills to help manage and/or decrease symptoms associated with their diagnosis. Symptoms:  Depressed/Irritable mood, worry, social withdrawal Problems Addressed:  Depressive thoughts, Sadness, Sleep issues, etc. Long Term Goals:  Pt to reduce overall level, frequency, and intensity of the feelings of depression as evidenced by decreased irritability, negative self talk, and helpless feelings from 6 to 7 days/week  to 0 to 1 days/week, per client report, for at least 3 consecutive months.  Progress: 10% Short Term Goals:  Pt to verbally express understanding of the relationship between feelings of depression and their impact on thinking patterns and behaviors.  Pt to verbalize an understanding of the role that distorted thinking plays in creating fears, excessive worry, and ruminations.  Progress: 10% Target Date:  08/15/2023 Frequency:  Bi-weekly Modality:  Cognitive Behavioral Therapy Interventions by Therapist:   Therapist will use CBT, Mindfulness exercises, Coping skills and Referrals, as needed by client. Client has verbally approved this treatment plan.  [name], [facility]

## 2023-01-23 ENCOUNTER — Ambulatory Visit (INDEPENDENT_AMBULATORY_CARE_PROVIDER_SITE_OTHER): Payer: 59 | Admitting: Psychology

## 2023-01-23 DIAGNOSIS — F331 Major depressive disorder, recurrent, moderate: Secondary | ICD-10-CM

## 2023-01-23 DIAGNOSIS — F1020 Alcohol dependence, uncomplicated: Secondary | ICD-10-CM | POA: Diagnosis not present

## 2023-01-23 NOTE — Progress Notes (Signed)
[facility] Counselor/Therapist Progress Note  Patient ID: Matthew Mckinney, MRN: [id number],    Date: 01/23/2023  Time Spent: 45 mins  Treatment Type: Individual Therapy  Reported Symptoms: Pt presents via WebEx video for this session.  Pt grants  consent for the session, stating he is in his home with no one else present.  I shared with pt that I am in my office with no one else here either.  Mental Status Exam: Appearance:  Casual     Behavior: Appropriate  Motor: Normal  Speech/Language:  Clear and Coherent  Affect: Appropriate  Mood: depressed  Thought process: normal  Thought content:   WNL  Sensory/Perceptual disturbances:   WNL  Orientation: oriented to person, place, and time/date  Attention: Good  Concentration: Good  Memory: WNL  Fund of knowledge:  Good  Insight:   Good  Judgment:  Good  Impulse Control: Good   Risk Assessment: Danger to Self:  No Self-injurious Behavior: No Danger to Others: No Duty to Warn:no Physical Aggression / Violence:No  Access to Firearms a concern: Yes ; pt agreed last session to give the keys to the safe to [name], but he has not done it yet.  He agrees to do it between now and next session. Gang Involvement:No   Subjective: Pt shares that "it is going OK.  [name] had her MRI yesterday and we have not had any feedback yet from the doctor; we are just waiting for the surgeon's office to call and set up a next appt.  I am not researching any more about the procedure because I started to scare myself for her.  It dawned on me about a week ago that I could focus on the potential benefits of the surgery for her, even if she has to go through some difficulty for a short while."  Pt shares that he is worrying about [name]; "I don't think I would worry about me at all if it was me that had to have the surgery, but for her to have to go through it is hard for me."  Pt shares that he has just been living with his anxiety; he  is working to distract himself and goes for a run every now and then, he plays video games some and he drinks and watches TV and goes to bed.  "I don't remember the last day I didn't drink since we got this news; I am drinking everyday but not getting really drunk everyday.  I want to get back to just drinking on the weekends but that takes so much effort and it is so easy to stop putting the effort in."  Pt has applied for the new position but has not heard about when the interview might occur.  He is not worried about that process.  Pt shares that he did not look into alcohol treatment programs, "and I know I will not do it.  It is too embarrassing.  I am not there yet.  I am OK talking to you about it but I don't want to talk to others about it."  Pt shares that he has not been sleeping well because of the increased alcohol use; he knows he will feel better if he drinks less.  Pt shares that [name] is doing well in MS (As and Bs in 6th grade-[employer]); [name] is doing well too (all As in 4th [employer]).     Encouraged pt to continue to think about his self care activities and  to engage with them as regularly as he can and we will meet in 2 wks for a follow up session.   Interventions: Cognitive Behavioral Therapy  Diagnosis:Alcohol use disorder, moderate, in controlled environment, dependence  Major depressive disorder, recurrent episode, moderate  Plan: Treatment Plan Strengths/Abilities:  Intelligent, Intuitive, Willing to participate in therapy Treatment Preferences:  Outpatient Individual Therapy Statement of Needs:  Patient is to use CBT, mindfulness and coping skills to help manage and/or decrease symptoms associated with their diagnosis. Symptoms:  Depressed/Irritable mood, worry, social withdrawal Problems Addressed:  Depressive thoughts, Sadness, Sleep issues, etc. Long Term Goals:  Pt to reduce overall level, frequency, and intensity of the feelings of depression as  evidenced by decreased irritability, negative self talk, and helpless feelings from 6 to 7 days/week to 0 to 1 days/week, per client report, for at least 3 consecutive months.  Progress: 10% Short Term Goals:  Pt to verbally express understanding of the relationship between feelings of depression and their impact on thinking patterns and behaviors.  Pt to verbalize an understanding of the role that distorted thinking plays in creating fears, excessive worry, and ruminations.  Progress: 10% Target Date:  08/15/2023 Frequency:  Bi-weekly Modality:  Cognitive Behavioral Therapy Interventions by Therapist:  Therapist will use CBT, Mindfulness exercises, Coping skills and Referrals, as needed by client. Client has verbally approved this treatment plan.  Karie Kirks, [facility]

## 2023-02-13 ENCOUNTER — Ambulatory Visit (INDEPENDENT_AMBULATORY_CARE_PROVIDER_SITE_OTHER): Payer: 59 | Admitting: Psychology

## 2023-02-13 DIAGNOSIS — F331 Major depressive disorder, recurrent, moderate: Secondary | ICD-10-CM

## 2023-02-13 DIAGNOSIS — F1020 Alcohol dependence, uncomplicated: Secondary | ICD-10-CM | POA: Diagnosis not present

## 2023-02-13 NOTE — Progress Notes (Signed)
[facility] Counselor/Therapist Progress Note  Patient ID: Matthew Mckinney, MRN: [id number],    Date: 02/13/2023  Time Spent: 45 mins  Treatment Type: Individual Therapy  Reported Symptoms: Pt presents via Caregility video for this session.  Pt grants  consent for the session, stating he is in his home with no one else present.  I shared with pt that I am in my office with no one else here either.  Mental Status Exam: Appearance:  Casual     Behavior: Appropriate  Motor: Normal  Speech/Language:  Clear and Coherent  Affect: Appropriate  Mood: depressed  Thought process: normal  Thought content:   WNL  Sensory/Perceptual disturbances:   WNL  Orientation: oriented to person, place, and time/date  Attention: Good  Concentration: Good  Memory: WNL  Fund of knowledge:  Good  Insight:   Good  Judgment:  Good  Impulse Control: Good   Risk Assessment: Danger to Self:  No Self-injurious Behavior: No Danger to Others: No Duty to Warn:no Physical Aggression / Violence:No  Access to Firearms a concern: Yes ; pt agreed last session to give the keys to the safe to [name], but he has not done it yet.  He agrees to do it between now and next session. Gang Involvement:No   Subjective: Pt shares that "it is going OK.  [name] has had her MRI and we have an appt on Monday with the Neurosurgeon and, hopefully, we will get our questions answered."  Pt is clearly still concerned because of the preliminary research he did previously; he has been reminding himself that this will likely be better for [facility] after the surgery.  Work continues to be busy at work; he continues to pursue the new position and the process is going well; his boss wants him to have the job so it should be no issue.  [name] are both ready for school to be out for summer.  Pt shares that he and the [place] with his [place] late [place]; they may also be getting together with  Donetta Potts mom this summer as well.  Pt shares that his drinking behavior has been a bit less but he is still drinking daily, without a break.  Pt continues to run the 2-mile loop in their neighborhood several days per week and feels good about that activity.  He still feels some issues with his hip and his knee but he is able to do the 2 miles without issues.  Pt also continues to play video games daily as well.  [name] continues to work as a Teacher, adult education daily, generally from 8am to 1pm.  She is starting a program at [employer] for medical office work and she is looking forward to that.  Pt continues to struggle with his sleep and he knows that is because of his drinking; his goal is still to not drink during the week in order to sleep better during the work week.  He also shares that he has not been taking his Adderall this week "and I feel the same.  I thinking about just stopping it all together."  Talked with pt about options and encouraged pt to talk with his doctor.  He will message his PCP to get his thoughts on this issue.  Pt shares that [name] is doing well in MS (As and Bs in 6th grade-[employer]); [name] is doing well too (all As in 4th [employer]).  Encouraged pt to continue to  think about his self care activities and to engage with them as regularly as he can and we will meet in 2 wks for a follow up session.   Interventions: Cognitive Behavioral Therapy  Diagnosis:Alcohol use disorder, moderate, in controlled environment, dependence (HCC)  Major depressive disorder, recurrent episode, moderate (HCC)  Plan: Treatment Plan Strengths/Abilities:  Intelligent, Intuitive, Willing to participate in therapy Treatment Preferences:  Outpatient Individual Therapy Statement of Needs:  Patient is to use CBT, mindfulness and coping skills to help manage and/or decrease symptoms associated with their diagnosis. Symptoms:  Depressed/Irritable mood, worry, social withdrawal Problems Addressed:   Depressive thoughts, Sadness, Sleep issues, etc. Long Term Goals:  Pt to reduce overall level, frequency, and intensity of the feelings of depression as evidenced by decreased irritability, negative self talk, and helpless feelings from 6 to 7 days/week to 0 to 1 days/week, per client report, for at least 3 consecutive months.  Progress: 10% Short Term Goals:  Pt to verbally express understanding of the relationship between feelings of depression and their impact on thinking patterns and behaviors.  Pt to verbalize an understanding of the role that distorted thinking plays in creating fears, excessive worry, and ruminations.  Progress: 10% Target Date:  08/15/2023 Frequency:  Bi-weekly Modality:  Cognitive Behavioral Therapy Interventions by Therapist:  Therapist will use CBT, Mindfulness exercises, Coping skills and Referrals, as needed by client. Client has verbally approved this treatment plan.  Karie Kirks, [facility]

## 2023-02-27 ENCOUNTER — Ambulatory Visit (INDEPENDENT_AMBULATORY_CARE_PROVIDER_SITE_OTHER): Payer: 59 | Admitting: Psychology

## 2023-02-27 DIAGNOSIS — F1020 Alcohol dependence, uncomplicated: Secondary | ICD-10-CM | POA: Diagnosis not present

## 2023-02-27 DIAGNOSIS — F331 Major depressive disorder, recurrent, moderate: Secondary | ICD-10-CM | POA: Diagnosis not present

## 2023-02-27 NOTE — Progress Notes (Signed)
[facility] Counselor/Therapist Progress Note  Patient ID: Matthew Mckinney, MRN: [id number],    Date: 02/27/2023  Time Spent: 45 mins  Treatment Type: Individual Therapy  Reported Symptoms: Pt presents via Caregility video for this session.  Pt grants consent for the session, stating he is in his home with no one else present.  I shared with pt that I am in my office with no one else here either.  Mental Status Exam: Appearance:  Casual     Behavior: Appropriate  Motor: Normal  Speech/Language:  Clear and Coherent  Affect: Appropriate  Mood: depressed  Thought process: normal  Thought content:   WNL  Sensory/Perceptual disturbances:   WNL  Orientation: oriented to person, place, and time/date  Attention: Good  Concentration: Good  Memory: WNL  Fund of knowledge:  Good  Insight:   Good  Judgment:  Good  Impulse Control: Good   Risk Assessment: Danger to Self:  No Self-injurious Behavior: No Danger to Others: No Duty to Warn:no Physical Aggression / Violence:No  Access to Firearms a concern: Yes ; pt agreed last session to give the keys to the safe to [name], but he has not done it yet.  He agrees to do it between now and next session. Gang Involvement:No   Subjective: Pt shares that "it is going OK.  Last week was a really poor work week that bled into my weekend.  There was so much crap going on and it felt like I was in arguments that were useless."  Pt shares he put a sticky note on his computer to remind him not to engage with these issues that are not his problems to solve.  Pt shares that he "was really negative and miserable last week, but this week has been better."  Pt shares they did meet with the neurosurgeon's NP and they explained why the surgery was imperative.  They have not scheduled the surgery yet.  The next meeting is tomorrow with the neurosurgeon himself.  They have a family vacation scheduled for June 22-29 so they want to find out when  the surgery needs to occur and what the recovery time might be.  Pt shares that [name] was with them in the office visit and has not shown any fear or concern yet.  Pt shares that he has continued to drink all of last week; "I did not drink Sunday and have not drank since.  It has been a choice not to drink."  Pt shares that he made this choice "because I have been so negative lately."  Pt shares that he has had thoughts of not wanting to be here.  Pt denies active suicidal plans or actions.  Pt shares, "I am more valuable to everybody else by being here, so I would never do anything to myself."  Pt shares that he feels better since he has not been drinking; he has played video games, worked in the yard, and watched TV.  Pt shares that he has not talked to [name] about feeling badly.  He shares that has been going to bed a lot earlier ("too early").  Pt shares that all of his stressors have come to be overwhelming for pt.  Pt also shares that their roof leaked last week and the insurance guy has been out to look at it and pt is waiting for an update.  Talked with pt about the possibility of looking into an antidepressant; pt is willing to think about a referral to  a psychiatrist for a medication review.  We will talk more about this in our follow up session.  Encouraged pt to continue to think about his self care activities and to engage with them as regularly as he can and we will meet in 2 wks for a follow up session.   Interventions: Cognitive Behavioral Therapy  Diagnosis:Alcohol use disorder, moderate, in controlled environment, dependence (HCC)  Major depressive disorder, recurrent episode, moderate (HCC)  Plan: Treatment Plan Strengths/Abilities:  Intelligent, Intuitive, Willing to participate in therapy Treatment Preferences:  Outpatient Individual Therapy Statement of Needs:  Patient is to use CBT, mindfulness and coping skills to help manage and/or decrease symptoms associated with their  diagnosis. Symptoms:  Depressed/Irritable mood, worry, social withdrawal Problems Addressed:  Depressive thoughts, Sadness, Sleep issues, etc. Long Term Goals:  Pt to reduce overall level, frequency, and intensity of the feelings of depression as evidenced by decreased irritability, negative self talk, and helpless feelings from 6 to 7 days/week to 0 to 1 days/week, per client report, for at least 3 consecutive months.  Progress: 10% Short Term Goals:  Pt to verbally express understanding of the relationship between feelings of depression and their impact on thinking patterns and behaviors.  Pt to verbalize an understanding of the role that distorted thinking plays in creating fears, excessive worry, and ruminations.  Progress: 10% Target Date:  08/15/2023 Frequency:  Bi-weekly Modality:  Cognitive Behavioral Therapy Interventions by Therapist:  Therapist will use CBT, Mindfulness exercises, Coping skills and Referrals, as needed by client. Client has verbally approved this treatment plan.  Karie Kirks, [facility]

## 2023-03-13 ENCOUNTER — Ambulatory Visit (INDEPENDENT_AMBULATORY_CARE_PROVIDER_SITE_OTHER): Payer: 59 | Admitting: Psychology

## 2023-03-13 DIAGNOSIS — F1020 Alcohol dependence, uncomplicated: Secondary | ICD-10-CM | POA: Diagnosis not present

## 2023-03-13 DIAGNOSIS — F331 Major depressive disorder, recurrent, moderate: Secondary | ICD-10-CM

## 2023-03-13 NOTE — Progress Notes (Signed)
[facility] Counselor/Therapist Progress Note  Patient ID: Matthew Mckinney, MRN: [id number],    Date: 03/13/2023  Time Spent: 45 mins  Treatment Type: Individual Therapy  Reported Symptoms: Pt presents via Caregility video for this session.  Pt grants consent for the session, stating he is in his home with no one else present.  I shared with pt that I am in my office with no one else here either.  Mental Status Exam: Appearance:  Casual     Behavior: Appropriate  Motor: Normal  Speech/Language:  Clear and Coherent  Affect: Appropriate  Mood: depressed  Thought process: normal  Thought content:   WNL  Sensory/Perceptual disturbances:   WNL  Orientation: oriented to person, place, and time/date  Attention: Good  Concentration: Good  Memory: WNL  Fund of knowledge:  Good  Insight:   Good  Judgment:  Good  Impulse Control: Good   Risk Assessment: Danger to Self:  No Self-injurious Behavior: No Danger to Others: No Duty to Warn:no Physical Aggression / Violence:No  Access to Firearms a concern: Yes ; pt agreed last session to give the keys to the safe to [name], but he has not done it yet.  He agrees to do it between now and next session. Gang Involvement:No   Subjective: Pt shares that "it is going OK.  The kids are finished with school work and they are trying every way they can to get out of going to school."  Evie said she threw up yesterday at school and pt went to pick her up; turns out she was faking.  Burnetta Sabin did not do well on one of her EOGs but she gets a chance to redo it on Monday.  Pt has his interview for his new position next Tuesday (6/11).  He will be interviewing with a panel of about 5-6 people; it will be in person at the office.  Pt has been playing guitar and is tending his tomato plants.  He has taken a break from video games "because they were pissing me off."  He is also watching a specific show with [name] and that is unusual for them but pt  is enjoying that activity.  Pt shares that his drinking is up and down; he is drinking the lightest beers possible.  He did not drink last week until Friday and has drank every day since, but only with the light beers.  Pt shares that he has not blacked out from drinking in several years.  Pt shares that the insurance company is not going to fix the roof so pt will have to make arrangements for roofers to come by and give estimates.  He shares that the roof was installed 15 yrs ago and he had them use 30 yr shingles so he is wondering if there is any sort of warranty with them.  Pt shares that they have a consult with the neurosurgeon on 6/19 to learn more about the date of the surgery.  They are still planning to go on vacation to [facility] 6/22-6/29 and he is taking time off from 7/3-7/7 to entertain friends from out of town; pt is looking forward to their visit and to entertaining them.  Pt shares that he often gets negative in his perspective and those are the the times where his thoughts turn darker; he again states that he will never attempt to harm himself because he has too many people depending on him.  He acknowledges that alcohol makes his negativity  worse.  Talked pt through the process of using visualization to help him to change his thought patterns and to help him no longer to focus on these negative thoughts.  Pt agrees to practice this technique between now and our follow up session.  Pt shares that he is still off of his Adderall and feels like it is better for him without it.  Encouraged pt to continue to think about his self care activities and to engage with them as regularly as he can and we will meet in 2 wks for a follow up session.   Interventions: Cognitive Behavioral Therapy  Diagnosis:Alcohol use disorder, moderate, in controlled environment, dependence (HCC)  Major depressive disorder, recurrent episode, moderate (HCC)  Plan: Treatment Plan Strengths/Abilities:  Intelligent,  Intuitive, Willing to participate in therapy Treatment Preferences:  Outpatient Individual Therapy Statement of Needs:  Patient is to use CBT, mindfulness and coping skills to help manage and/or decrease symptoms associated with their diagnosis. Symptoms:  Depressed/Irritable mood, worry, social withdrawal Problems Addressed:  Depressive thoughts, Sadness, Sleep issues, etc. Long Term Goals:  Pt to reduce overall level, frequency, and intensity of the feelings of depression as evidenced by decreased irritability, negative self talk, and helpless feelings from 6 to 7 days/week to 0 to 1 days/week, per client report, for at least 3 consecutive months.  Progress: 10% Short Term Goals:  Pt to verbally express understanding of the relationship between feelings of depression and their impact on thinking patterns and behaviors.  Pt to verbalize an understanding of the role that distorted thinking plays in creating fears, excessive worry, and ruminations.  Progress: 10% Target Date:  08/15/2023 Frequency:  Bi-weekly Modality:  Cognitive Behavioral Therapy Interventions by Therapist:  Therapist will use CBT, Mindfulness exercises, Coping skills and Referrals, as needed by client. Client has verbally approved this treatment plan.  Karie Kirks, [facility]

## 2023-03-27 ENCOUNTER — Ambulatory Visit (INDEPENDENT_AMBULATORY_CARE_PROVIDER_SITE_OTHER): Payer: 59 | Admitting: Psychology

## 2023-03-27 DIAGNOSIS — F1020 Alcohol dependence, uncomplicated: Secondary | ICD-10-CM | POA: Diagnosis not present

## 2023-03-27 DIAGNOSIS — F331 Major depressive disorder, recurrent, moderate: Secondary | ICD-10-CM | POA: Diagnosis not present

## 2023-03-27 NOTE — Progress Notes (Signed)
[facility] Counselor/Therapist Progress Note  Patient ID: Matthew Mckinney, MRN: [id number],    Date: 03/27/2023  Time Spent: 45 mins  Treatment Type: Individual Therapy  Reported Symptoms: Pt presents via Caregility video for this session.  Pt grants consent for the session, stating he is in his home with no one else present.  I shared with pt that I am in my office with no one else here either.  Mental Status Exam: Appearance:  Casual     Behavior: Appropriate  Motor: Normal  Speech/Language:  Clear and Coherent  Affect: Appropriate  Mood: depressed  Thought process: normal  Thought content:   WNL  Sensory/Perceptual disturbances:   WNL  Orientation: oriented to person, place, and time/date  Attention: Good  Concentration: Good  Memory: WNL  Fund of knowledge:  Good  Insight:   Good  Judgment:  Good  Impulse Control: Good   Risk Assessment: Danger to Self:  No Self-injurious Behavior: No Danger to Others: No Duty to Warn:no Physical Aggression / Violence:No  Access to Firearms a concern: Yes ; pt agreed last session to give the keys to the safe to Matthew Mckinney, but he has not done it yet.  He agrees to do it between now and next session. Gang Involvement:No   Subjective: Pt shares that "I Mckinney been doing pretty well.  Matthew Mckinney's appt is this afternoon at Matthew Mckinney; I think I am ready for it.  It is interesting to me that she has not expressed any feelings about the procedure at all, which is interesting to me because I know her so well."  Pt shares that he and Matthew Mckinney actually not talked much about the procedure because they do not yet Mckinney any concrete info about it; they hope to get all of their questions answered this afternoon.  Pt also shares that he did do his interview for the new position on 6/11; he felt he did a good job but then started to wonder if he could Mckinney done better.  His boss told him on Tuesday that he scored the highest on the  interviews.  His boss also told him that he is planning to pay him more than he is making in his current position.  Pt feels good about his options for the new job now but he didn't feel great right after the interview.  He has been busy at work; had to work through last weekend and his boss gave him a day off which he appreciated.  He took the girls to lunch and showed them around downtown and they had a good day.  He shares he has been running about every other day and feels like that has been good for him.  Pt brings up my comment last session that we, as humans, Mckinney the capacity to choose what we think about.  He has been thinking about that and he shares that he does not think he can control what he thinks about.  He has talked with others and they Mckinney told he they do not think they can control their thoughts.  Pt is willing to continue to think more about the concept and he wants to get better at it.  Pt shares his tomato plants are doing well and he is working with them daily.  They are going on vacation on Saturday for a week and he is looking forward to that trip.  They are going to [place] and he is looking forward to going  to Matthew Mckinney as well.  The house they are staying in has a pontoon boat and they are looking forward to riding around the lake on it.  Pt continues to drink regularly but is still using the light beers so he feels like it is better for him.  Pt is still having friends come into town from 7/3-7/7 and is looking forward to entertaining them.  He also wants to get back into working out with weights but is planning to start when he gets back from vacation.  He also wants to get back on his bike as well.  Asked pt to continue working on the idea that he can control his thoughts and allow himself to practice the idea between now and our follow up session.  Encouraged pt to continue to think about his self care activities and to engage with them as regularly as he can and we will meet in 3  wks for a follow up session, due to pt's vacation and his friends coming to town over the July 4th holiday.   Interventions: Cognitive Behavioral Therapy  Diagnosis:Alcohol use disorder, moderate, in controlled environment, dependence (HCC)  Major depressive disorder, recurrent episode, moderate (HCC)  Plan: Treatment Plan Strengths/Abilities:  Intelligent, Intuitive, Willing to participate in therapy Treatment Preferences:  Outpatient Individual Therapy Statement of Needs:  Patient is to use CBT, mindfulness and coping skills to help manage and/or decrease symptoms associated with their diagnosis. Symptoms:  Depressed/Irritable mood, worry, social withdrawal Problems Addressed:  Depressive thoughts, Sadness, Sleep issues, etc. Long Term Goals:  Pt to reduce overall level, frequency, and intensity of the feelings of depression as evidenced by decreased irritability, negative self talk, and helpless feelings from 6 to 7 days/week to 0 to 1 days/week, per client report, for at least 3 consecutive months.  Progress: 10% Short Term Goals:  Pt to verbally express understanding of the relationship between feelings of depression and their impact on thinking patterns and behaviors.  Pt to verbalize an understanding of the role that distorted thinking plays in creating fears, excessive worry, and ruminations.  Progress: 10% Target Date:  08/15/2023 Frequency:  Bi-weekly Modality:  Cognitive Behavioral Therapy Interventions by Therapist:  Therapist will use CBT, Mindfulness exercises, Coping skills and Referrals, as needed by client. Client has verbally approved this treatment plan.  Karie Kirks, [facility]

## 2023-04-17 ENCOUNTER — Ambulatory Visit (INDEPENDENT_AMBULATORY_CARE_PROVIDER_SITE_OTHER): Payer: 59 | Admitting: Psychology

## 2023-04-17 DIAGNOSIS — F331 Major depressive disorder, recurrent, moderate: Secondary | ICD-10-CM | POA: Diagnosis not present

## 2023-04-17 DIAGNOSIS — F1020 Alcohol dependence, uncomplicated: Secondary | ICD-10-CM | POA: Diagnosis not present

## 2023-04-17 NOTE — Progress Notes (Signed)
[facility] Counselor/Therapist Progress Note  Patient ID: Matthew Mckinney, MRN: [id number],    Date: 04/17/2023  Time Spent: 50 mins  start time: 0900  end time: 0950  Treatment Type: Individual Therapy  Reported Symptoms: Pt presents via Caregility video for this session.  Pt grants consent for the session, stating he is in his home with no one else present; pt acknowledges the limits of virtual sessions.  I shared with pt that I am in my office with no one else here either.  Mental Status Exam: Appearance:  Casual     Behavior: Appropriate  Motor: Normal  Speech/Language:  Clear and Coherent  Affect: Appropriate  Mood: depressed  Thought process: normal  Thought content:   WNL  Sensory/Perceptual disturbances:   WNL  Orientation: oriented to person, place, and time/date  Attention: Good  Concentration: Good  Memory: WNL  Fund of knowledge:  Good  Insight:   Good  Judgment:  Good  Impulse Control: Good   Risk Assessment: Danger to Self:  No Self-injurious Behavior: No Danger to Others: No Duty to Warn:no Physical Aggression / Violence:No  Access to Firearms a concern: Yes ; pt agreed last session to give the keys to the safe to [name], but he has not done it yet.  He agrees to do it between now and next session. Gang Involvement:No   Subjective: Pt shares that "I have been doing pretty good; vacation was good but I was happy about coming home.  We had some friends over on July 3rd and that was a great day.  I went to visit my grandmothers in [place] and they are doing OK; they are both in their 12s."  Pt shares that they did have the appt with [name]'s neurosurgeon but they did not get as many of their questions answered; pt was pleased with the doctor and his staff; pt shares that they have to call to schedule the procedure, likely for next summer.  The surgeon told them they would have her up and walking the day after surgery.  Pt expresses worry for his  daughter and pauses to consider his thoughts.  The surgeon is so busy that he cannot do it this summer; they will get on the schedule for next summer and the surgeon indicated that it would be fine.  Pt shares that he has gotten notification since our last session that the [place] is not going to fill the position for which he applied; there are lots of other departments effected by this position and HR is considering getting involved in the process.  His bosses told him it would likely be posted again in the future and they encouraged him to apply again when it is posted again.  Pt shares, "I am mad about the decision to put it off at this time; I don't know why these questions were not worked out before they posted the job this time."  Pt shares the roofers are coming Friday to replace his roof; depending on weather.  Pt shares that his tomatoes are coming along and he is pleased with how they are doing.  [place] maybe more liquor in the evenings than normal.  Since getting back home, I am back to drinking light beers and am finishing the liquor from vacation.  I am planning to have a sobriety week coming up next week because I will be in training in [place] next  week.  I am going to be working with the [employer] folks. Encouraged pt to continue to think about his self care activities and to engage with them as regularly as he can and we will meet in 2 wks for a follow up session.   Interventions: Cognitive Behavioral Therapy  Diagnosis:Alcohol use disorder, moderate, in controlled environment, dependence (HCC)  Major depressive disorder, recurrent episode, moderate (HCC)  Plan: Treatment Plan Strengths/Abilities:  Intelligent, Intuitive, Willing to participate in therapy Treatment Preferences:  Outpatient Individual Therapy Statement of Needs:  Patient is to use CBT, mindfulness and coping skills to help manage and/or  decrease symptoms associated with their diagnosis. Symptoms:  Depressed/Irritable mood, worry, social withdrawal Problems Addressed:  Depressive thoughts, Sadness, Sleep issues, etc. Long Term Goals:  Pt to reduce overall level, frequency, and intensity of the feelings of depression as evidenced by decreased irritability, negative self talk, and helpless feelings from 6 to 7 days/week to 0 to 1 days/week, per client report, for at least 3 consecutive months.  Progress: 10% Short Term Goals:  Pt to verbally express understanding of the relationship between feelings of depression and their impact on thinking patterns and behaviors.  Pt to verbalize an understanding of the role that distorted thinking plays in creating fears, excessive worry, and ruminations.  Progress: 10% Target Date:  08/15/2023 Frequency:  Bi-weekly Modality:  Cognitive Behavioral Therapy Interventions by Therapist:  Therapist will use CBT, Mindfulness exercises, Coping skills and Referrals, as needed by client. Client has verbally approved this treatment plan.  Karie Kirks, [facility]

## 2023-05-01 ENCOUNTER — Ambulatory Visit (INDEPENDENT_AMBULATORY_CARE_PROVIDER_SITE_OTHER): Payer: 59 | Admitting: Psychology

## 2023-05-01 DIAGNOSIS — F331 Major depressive disorder, recurrent, moderate: Secondary | ICD-10-CM

## 2023-05-01 DIAGNOSIS — F1020 Alcohol dependence, uncomplicated: Secondary | ICD-10-CM

## 2023-05-01 NOTE — Progress Notes (Signed)
[facility] Counselor/Therapist Progress Note  Patient ID: Matthew Mckinney, MRN: [id number],    Date: 05/01/2023  Time Spent: 50 mins  start time: 1000  end time: 1050  Treatment Type: Individual Therapy  Reported Symptoms: Pt presents via Caregility video for this session.  Pt grants consent for the session, stating he is in his home with no one else present; pt acknowledges the limits of virtual sessions.  I shared with pt that I am in my office with no one else here either.  Mental Status Exam: Appearance:  Casual     Behavior: Appropriate  Motor: Normal  Speech/Language:  Clear and Coherent  Affect: Appropriate  Mood: depressed  Thought process: normal  Thought content:   WNL  Sensory/Perceptual disturbances:   WNL  Orientation: oriented to person, place, and time/date  Attention: Good  Concentration: Good  Memory: WNL  Fund of knowledge:  Good  Insight:   Good  Judgment:  Good  Impulse Control: Good   Risk Assessment: Danger to Self:  No Self-injurious Behavior: No Danger to Others: No Duty to Warn:no Physical Aggression / Violence:No  Access to Firearms a concern: Yes ; pt agreed last session to give the keys to the safe to [name], but he has not done it yet.  He agrees to do it between now and next session. Gang Involvement:No   Subjective: Pt shares that "I have been doing well since our last session.  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  There were about 40 people on our team and I was given an "MVP award" for my part in the training.  I was up at 5am each day and was going to bed by 8-830p each evening."  Pt shares there were only a few incidents of stress during the training and it was hard work but he really enjoyed the experience.  It was a training sponsored by the [employer] to support [employer] authorities like [place].  This is how pt got  chosen for the training.  Pt shares that he drank each evening and was feeling down and depressed each evening alone in the hotel.  Pt shares that one particular evening was really difficult for him, feeling isolated.  Pt shares, "I was just really pissed off with work and it was a really negative night.  I got over it the next day after talking with my boss.  I have really good bosses."  Pt shared with his boss this week that he does not want to do anymore out of town trainings when he is by himself.  He shares he is not sure that having a colleague at the training  would have helped him feel any better.  Pt is still waiting for the roofers to come replace his roof.  He has been working out this week since he has gotten back from [place] and feels good about what he is doing there; it has been too hot for him to continue running.  Pt's tomatoes are continuing to produce lots of fruit and they are enjoying them as a family.  Pt shares that his mom and dad are doing well.  Pt is continuing to get back into work issues this week after being out last week.  Pt had lunch with a friend of his on Monday and enjoyed that.  Encouraged pt to continue to think  about his self care activities and to engage with them as regularly as he can and we will meet in 2 wks for a follow up session.   Interventions: Cognitive Behavioral Therapy  Diagnosis:Alcohol use disorder, moderate, in controlled environment, dependence (HCC)  Major depressive disorder, recurrent episode, moderate (HCC)  Plan: Treatment Plan Strengths/Abilities:  Intelligent, Intuitive, Willing to participate in therapy Treatment Preferences:  Outpatient Individual Therapy Statement of Needs:  Patient is to use CBT, mindfulness and coping skills to help manage and/or decrease symptoms associated with their diagnosis. Symptoms:  Depressed/Irritable mood, worry, social withdrawal Problems Addressed:  Depressive thoughts, Sadness, Sleep issues, etc. Long  Term Goals:  Pt to reduce overall level, frequency, and intensity of the feelings of depression as evidenced by decreased irritability, negative self talk, and helpless feelings from 6 to 7 days/week to 0 to 1 days/week, per client report, for at least 3 consecutive months.  Progress: 10% Short Term Goals:  Pt to verbally express understanding of the relationship between feelings of depression and their impact on thinking patterns and behaviors.  Pt to verbalize an understanding of the role that distorted thinking plays in creating fears, excessive worry, and ruminations.  Progress: 10% Target Date:  08/15/2023 Frequency:  Bi-weekly Modality:  Cognitive Behavioral Therapy Interventions by Therapist:  Therapist will use CBT, Mindfulness exercises, Coping skills and Referrals, as needed by client. Client has verbally approved this treatment plan.  Karie Kirks, [facility]

## 2023-05-15 ENCOUNTER — Ambulatory Visit (INDEPENDENT_AMBULATORY_CARE_PROVIDER_SITE_OTHER): Payer: 59 | Admitting: Psychology

## 2023-05-15 DIAGNOSIS — F1020 Alcohol dependence, uncomplicated: Secondary | ICD-10-CM

## 2023-05-15 DIAGNOSIS — F331 Major depressive disorder, recurrent, moderate: Secondary | ICD-10-CM | POA: Diagnosis not present

## 2023-05-15 NOTE — Progress Notes (Signed)
[facility] Counselor/Therapist Progress Note  Patient ID: Matthew Mckinney, MRN: [id number],    Date: 05/15/2023  Time Spent: 60 mins  start time: 0900  end time: 1000  Treatment Type: Individual Therapy  Reported Symptoms: Pt presents via Caregility video for this session.  Pt grants consent for the session, stating he is in his home with no one else present; pt acknowledges the limits of virtual sessions.  I shared with pt that I am in my office with no one else here either.  Mental Status Exam: Appearance:  Casual     Behavior: Appropriate  Motor: Normal  Speech/Language:  Clear and Coherent  Affect: Appropriate  Mood: depressed  Thought process: normal  Thought content:   WNL  Sensory/Perceptual disturbances:   WNL  Orientation: oriented to person, place, and time/date  Attention: Good  Concentration: Good  Memory: WNL  Fund of knowledge:  Good  Insight:   Good  Judgment:  Good  Impulse Control: Good   Risk Assessment: Danger to Self:  No Self-injurious Behavior: No Danger to Others: No Duty to Warn:no Physical Aggression / Violence:No  Access to Firearms a concern: Yes ; pt agreed last session to give the keys to the safe to [name], but he has not done it yet.  He agrees to do it between now and next session. Gang Involvement:No   Subjective: Pt shares that "I have been doing well since our last session.  Work has been really slow and I have had these wide open days and I have just been coasting quite frankly."  Pt shares that he continues to reflect on his training in [place] and how well he performed there and the affirmations that were meaningful for him.  Encouraged pt to continue to reflect on that experience again as frequently as he can to get the benefits of it for him.  Pt shares that [name] has flipped her sleep schedule so they are working to get her back on schedule sooner rather than later.  She will be in the 7th grade this year; she loves  being in the band, playing clarinet (bass clarinet) and has enjoyed taking lessons in addition to playing in the band.  [name] is enjoying crafts this summer; she is making masks and decorates them.  She will be in the 5th grade this year in school.  Pt shares that he has continued to work in his garden and has been exercising as well (weights and running); "I also love cooking and do a lot of meal prep on Sundays."  Congratulated pt on being out their running and working out.  He is also playing his guitar and he and his buddies are planning to play an event for Halloween so he is looking forward to that.  [name] is pursuing a certificate from [employer] related to medical office work and she has enjoyed that.  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  Pt shares he continues to drink daily but is drinking less and beer with less alcohol content.  He is thinking about taking a break from drinking at some point soon.  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  Encouraged pt to continue to think about his self care activities and to engage with them as regularly as he can and we will meet in 2 wks  for a follow up session.   Interventions: Cognitive Behavioral Therapy  Diagnosis:Alcohol use disorder, moderate, in controlled environment, dependence (HCC)  Major depressive disorder, recurrent episode, moderate (HCC)  Plan: Treatment Plan Strengths/Abilities:  Intelligent, Intuitive, Willing to participate in therapy Treatment Preferences:  Outpatient Individual Therapy Statement of Needs:  Patient is to use CBT, mindfulness and coping skills to help manage and/or decrease symptoms associated with their diagnosis. Symptoms:  Depressed/Irritable mood, worry, social withdrawal Problems Addressed:  Depressive thoughts, Sadness, Sleep issues, etc. Long Term Goals:  Pt to reduce overall  level, frequency, and intensity of the feelings of depression as evidenced by decreased irritability, negative self talk, and helpless feelings from 6 to 7 days/week to 0 to 1 days/week, per client report, for at least 3 consecutive months.  Progress: 10% Short Term Goals:  Pt to verbally express understanding of the relationship between feelings of depression and their impact on thinking patterns and behaviors.  Pt to verbalize an understanding of the role that distorted thinking plays in creating fears, excessive worry, and ruminations.  Progress: 10% Target Date:  08/15/2023 Frequency:  Bi-weekly Modality:  Cognitive Behavioral Therapy Interventions by Therapist:  Therapist will use CBT, Mindfulness exercises, Coping skills and Referrals, as needed by client. Client has verbally approved this treatment plan.  Karie Kirks, [facility]

## 2023-05-29 ENCOUNTER — Ambulatory Visit (INDEPENDENT_AMBULATORY_CARE_PROVIDER_SITE_OTHER): Payer: 59 | Admitting: Psychology

## 2023-05-29 DIAGNOSIS — F331 Major depressive disorder, recurrent, moderate: Secondary | ICD-10-CM | POA: Diagnosis not present

## 2023-05-29 DIAGNOSIS — F1020 Alcohol dependence, uncomplicated: Secondary | ICD-10-CM

## 2023-05-29 NOTE — Progress Notes (Signed)
[facility] Counselor/Therapist Progress Note  Patient ID: Matthew Mckinney, MRN: [id number],    Date: 05/29/2023  Time Spent: 45 mins  start time: 0900  end time: 0945  Treatment Type: Individual Therapy  Reported Symptoms: Pt presents via Caregility video for this session.  Pt grants consent for the session, stating he is in his home with no one else present; pt acknowledges the limits of virtual sessions.  I shared with pt that I am in my office with no one else here either.  Mental Status Exam: Appearance:  Casual     Behavior: Appropriate  Motor: Normal  Speech/Language:  Clear and Coherent  Affect: Appropriate  Mood: depressed  Thought process: normal  Thought content:   WNL  Sensory/Perceptual disturbances:   WNL  Orientation: oriented to person, place, and time/date  Attention: Good  Concentration: Good  Memory: WNL  Fund of knowledge:  Good  Insight:   Good  Judgment:  Good  Impulse Control: Good   Risk Assessment: Danger to Self:  No Self-injurious Behavior: No Danger to Others: No Duty to Warn:no Physical Aggression / Violence:No  Access to Firearms a concern: Yes ; pt agreed last session to give the keys to the safe to Matthew Mckinney, but he has not done it yet.  He agrees to do it between now and next session. Gang Involvement:No   Subjective: Pt shares that "I have been doing well since our last session.  Work is busy and home is good.  [name] are getting ready to go back to school on Monday (middle school and elementary for the last year there).  Pt shares that he was sober Monday-Thursday last week; had 2 beers this past Monday and none Tuesday; will not drink today or tomorrow."  He notes that his mood is better when he goes a few days without drinking.  He knows it is good for him to feel better and he likes to continue those episodes of sobriety.  Encouraged pt to continue to pursue these options.  Pt has not seen the job he previously applied  for be re-posted yet; he shares that he does not really want to do the job but he does want the extra money.  Pt shares that he finished reading a book last night; "I sat and read 50 pages of a book last night for an hour and that was very unusual for me.  Pt has been smoking meats for the past two weekends and he and the family enjoyed them; that felt good for pt.  Pt has been running some and has enjoyed that activity.  Encouraged pt to continue to think about his self care activities and to engage with them as regularly as he can and we will meet in 2 wks for a follow up session.   Interventions: Cognitive Behavioral Therapy  Diagnosis:Alcohol use disorder, moderate, in controlled environment, dependence (HCC)  Major depressive disorder, recurrent episode, moderate (HCC)  Plan: Treatment Plan Strengths/Abilities:  Intelligent, Intuitive, Willing to participate in therapy Treatment Preferences:  Outpatient Individual Therapy Statement of Needs:  Patient is to use CBT, mindfulness and coping skills to help manage and/or decrease symptoms associated with their diagnosis. Symptoms:  Depressed/Irritable mood, worry, social withdrawal Problems Addressed:  Depressive thoughts, Sadness, Sleep issues, etc. Long Term Goals:  Pt to reduce overall level, frequency, and intensity of the feelings of depression as evidenced by decreased irritability, negative self talk, and helpless feelings from 6 to 7 days/week to 0  to 1 days/week, per client report, for at least 3 consecutive months.  Progress: 10% Short Term Goals:  Pt to verbally express understanding of the relationship between feelings of depression and their impact on thinking patterns and behaviors.  Pt to verbalize an understanding of the role that distorted thinking plays in creating fears, excessive worry, and ruminations.  Progress: 10% Target Date:  08/15/2023 Frequency:  Bi-weekly Modality:  Cognitive Behavioral Therapy Interventions by  Therapist:  Therapist will use CBT, Mindfulness exercises, Coping skills and Referrals, as needed by client. Client has verbally approved this treatment plan.  Karie Kirks, [facility]

## 2023-06-12 ENCOUNTER — Ambulatory Visit (INDEPENDENT_AMBULATORY_CARE_PROVIDER_SITE_OTHER): Payer: 59 | Admitting: Psychology

## 2023-06-12 DIAGNOSIS — F1020 Alcohol dependence, uncomplicated: Secondary | ICD-10-CM

## 2023-06-12 DIAGNOSIS — F331 Major depressive disorder, recurrent, moderate: Secondary | ICD-10-CM

## 2023-06-12 NOTE — Progress Notes (Signed)
[facility] Counselor/Therapist Progress Note  Patient ID: Matthew Mckinney, MRN: [id number],    Date: 06/12/2023  Time Spent: 30 mins  start time: 1000  end time: 1030  Treatment Type: Individual Therapy  Reported Symptoms: Pt presents via Caregility video for this session.  Pt grants consent for the session, stating he is in his home with no one else present; pt acknowledges the limits of virtual sessions.  I shared with pt that I am in my office with no one else here either.  Mental Status Exam: Appearance:  Casual     Behavior: Appropriate  Motor: Normal  Speech/Language:  Clear and Coherent  Affect: Appropriate  Mood: depressed  Thought process: normal  Thought content:   WNL  Sensory/Perceptual disturbances:   WNL  Orientation: oriented to person, place, and time/date  Attention: Good  Concentration: Good  Memory: WNL  Fund of knowledge:  Good  Insight:   Good  Judgment:  Good  Impulse Control: Good   Risk Assessment: Danger to Self:  No Self-injurious Behavior: No Danger to Others: No Duty to Warn:no Physical Aggression / Violence:No  Access to Firearms a concern: Yes ; pt agreed last session to give the keys to the safe to Matthew Mckinney, but he has not done it yet.  He agrees to do it between now and next session. Gang Involvement:No   Subjective: Pt shares that "I have been doing well since our last session.  I have continued to not drink during the week and that has felt good; this week has been a little different with the holiday on Monday.  Work has been slow in a good way but it is still annoying but that is not really a problem.  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  Matthew Mckinney is in the 7th grade and tried out for the [employer] team and she made the team.  Pt  shares she is competitive and she enjoys athletics so this is a great opportunity for her.  Her first game will be on Thursday of this week.  Pt shares that Matthew Mckinney is doing well; she has been busy with work.  Pt shares that he has tried to find another book to read but he has not settled on one yet.  He continues to notice that his sleep is better when he is drinking less.  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  Pt has been grilling a lot lately and has enjoyed that activity and the food that has resulted from it.  Pt plans to get back into running and riding his bike as well; his symptoms have gotten in the way a bit with the activities.  Encouraged pt to continue to think about his self care activities and to engage with them as regularly as he can and we will meet in 2 wks for a follow up session.   Interventions: Cognitive Behavioral Therapy  Diagnosis:Alcohol use disorder, moderate, in controlled environment, dependence (HCC)  Major depressive disorder, recurrent episode, moderate (HCC)  Plan: Treatment Plan Strengths/Abilities:  Intelligent, Intuitive, Willing to participate in therapy Treatment Preferences:  Outpatient Individual Therapy Statement  of Needs:  Patient is to use CBT, mindfulness and coping skills to help manage and/or decrease symptoms associated with their diagnosis. Symptoms:  Depressed/Irritable mood, worry, social withdrawal Problems Addressed:  Depressive thoughts, Sadness, Sleep issues, etc. Long Term Goals:  Pt to reduce overall level, frequency, and intensity of the feelings of depression as evidenced by decreased irritability, negative self talk, and helpless feelings from 6 to 7 days/week to 0 to 1 days/week, per client report, for at least 3 consecutive months.  Progress: 10% Short Term Goals:  Pt to verbally express understanding of the  relationship between feelings of depression and their impact on thinking patterns and behaviors.  Pt to verbalize an understanding of the role that distorted thinking plays in creating fears, excessive worry, and ruminations.  Progress: 10% Target Date:  08/15/2023 Frequency:  Bi-weekly Modality:  Cognitive Behavioral Therapy Interventions by Therapist:  Therapist will use CBT, Mindfulness exercises, Coping skills and Referrals, as needed by client. Client has verbally approved this treatment plan.  Matthew Mckinney, [facility]

## 2023-06-27 ENCOUNTER — Ambulatory Visit (INDEPENDENT_AMBULATORY_CARE_PROVIDER_SITE_OTHER): Payer: 59 | Admitting: Psychology

## 2023-06-27 DIAGNOSIS — F331 Major depressive disorder, recurrent, moderate: Secondary | ICD-10-CM | POA: Diagnosis not present

## 2023-06-27 DIAGNOSIS — F1020 Alcohol dependence, uncomplicated: Secondary | ICD-10-CM

## 2023-06-27 NOTE — Progress Notes (Signed)
[facility] Counselor/Therapist Progress Note  Patient ID: Matthew Mckinney, MRN: [id number],    Date: 06/27/2023  Time Spent: 60 mins  start time: 0900  end time: 1000  Treatment Type: Individual Therapy  Reported Symptoms: Pt presents via Caregility video for this session.  Pt grants consent for the session, stating he is in his home with no one else present; pt acknowledges the limits of virtual sessions.  I shared with pt that I am in my office with no one else here either.  Mental Status Exam: Appearance:  Casual     Behavior: Appropriate  Motor: Normal  Speech/Language:  Clear and Coherent  Affect: Appropriate  Mood: depressed  Thought process: normal  Thought content:   WNL  Sensory/Perceptual disturbances:   WNL  Orientation: oriented to person, place, and time/date  Attention: Good  Concentration: Good  Memory: WNL  Fund of knowledge:  Good  Insight:   Good  Judgment:  Good  Impulse Control: Good   Risk Assessment: Danger to Self:  No Self-injurious Behavior: No Danger to Others: No Duty to Warn:no Physical Aggression / Violence:No  Access to Firearms a concern: Yes ; pt agreed last session to give the keys to the safe to [name], but he has not done it yet.  He agrees to do it between now and next session. Gang Involvement:No   Subjective: Pt shares that "I have been doing OK, I guess.  I have just been feeling really annoyed lately with work.  Things at work seem to be a pain in the ass.  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  Pt shares that he has emailed his bosses about his frustrations; talked with pt about the opportunity of making an appt with his bosses to go in person and talking with them about his frustration.   Pt continues to have his GI symptoms and believes the symptoms are exacerbated by his frustrations with work.  Talked with pt about deep breathing and mediation and encouraged pt to consider these options.  Pt plans to get back into running and riding his bike as well; his symptoms have gotten in the way a bit with the activities.  Encouraged pt to continue to think about his self care activities and to engage with them as regularly as he can and we will meet in 2 wks for a follow up session.   Interventions: Cognitive Behavioral Therapy  Diagnosis:Alcohol use disorder, moderate, in controlled environment, dependence (HCC)  Major depressive disorder, recurrent episode, moderate (HCC)  Plan: Treatment Plan Strengths/Abilities:  Intelligent, Intuitive, Willing to participate in therapy Treatment Preferences:  Outpatient Individual Therapy Statement of Needs:  Patient is to use CBT, mindfulness and coping skills to help manage and/or decrease symptoms associated with their diagnosis. Symptoms:  Depressed/Irritable mood, worry, social withdrawal Problems Addressed:  Depressive thoughts, Sadness, Sleep issues, etc. Long Term Goals:  Pt to reduce overall level, frequency, and intensity of the feelings of depression as evidenced by decreased irritability, negative self talk, and helpless feelings from 6 to 7 days/week to 0 to 1 days/week, per client report, for at least 3 consecutive months.  Progress: 10% Short Term Goals:  Pt to verbally express understanding of the relationship between  feelings of depression and their impact on thinking patterns and behaviors.  Pt to verbalize an understanding of the role that distorted thinking plays in creating fears, excessive worry, and ruminations.  Progress: 10% Target Date:  08/15/2023 Frequency:  Bi-weekly Modality:  Cognitive Behavioral Therapy Interventions by Therapist:  Therapist will use CBT, Mindfulness exercises, Coping skills and Referrals, as needed  by client. Client has verbally approved this treatment plan.  Karie Kirks, [facility]

## 2023-07-10 ENCOUNTER — Ambulatory Visit: Payer: 59 | Admitting: Psychology

## 2023-07-10 DIAGNOSIS — F1020 Alcohol dependence, uncomplicated: Secondary | ICD-10-CM

## 2023-07-10 DIAGNOSIS — F331 Major depressive disorder, recurrent, moderate: Secondary | ICD-10-CM

## 2023-07-10 NOTE — Progress Notes (Signed)
[facility] Counselor/Therapist Progress Note  Patient ID: Matthew Mckinney, MRN: [id number],    Date: 07/10/2023  Time Spent: 45 mins  start time: 1000  end time: 1045  Treatment Type: Individual Therapy  Reported Symptoms: Pt presents via Caregility video for this session.  Pt grants consent for the session, stating he is in his home with no one else present; pt acknowledges the limits of virtual sessions.  I shared with pt that I am in my office with no one else here either.  Mental Status Exam: Appearance:  Casual     Behavior: Appropriate  Motor: Normal  Speech/Language:  Clear and Coherent  Affect: Appropriate  Mood: depressed  Thought process: normal  Thought content:   WNL  Sensory/Perceptual disturbances:   WNL  Orientation: oriented to person, place, and time/date  Attention: Good  Concentration: Good  Memory: WNL  Fund of knowledge:  Good  Insight:   Good  Judgment:  Good  Impulse Control: Good   Risk Assessment: Danger to Self:  No Self-injurious Behavior: No Danger to Others: No Duty to Warn:no Physical Aggression / Violence:No  Access to Firearms a concern: Yes ; pt agreed last session to give the keys to the safe to [name], but he has not done it yet.  He agrees to do it between now and next session. Gang Involvement:No   Subjective: Pt shares that "I have probably been pretty crazy since our last session.  I had my new job interview yesterday and it went fine but I am so glad it is over; I feel relieved that it is past now.  I felt like I gave good answers to the questions they asked.  He says that they told him they would let him know who they chose within a couple of weeks.  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  Pt shares that he was able to let go  of his work-based frustration since our last session; he knew it was not good for him to carry that stress around so he finally decided to let it go.  He describes it as a choice on his part to let it go.  Pt shares his GI symptoms have gotten better since our last session; he has gotten additional medicine and that was helpful for him.  Pt continues to use his deep breathing whenever he experiences stress.  Pt shares that he is planning to re-engage with his exercise routines (bike, walking/running, working out with weights, etc.).  Pt shares that [name] and her friends were supposed to go to the mountains this past weekend and they changed it due to the weather and will be going to the beach soon.  Pt shares that he and [name] went to see [name] in her last volleyball match of the season this past week.  [name] has also asked to be connected with a counselor and she saw her therapist for the first time yesterday.  Pt shares that he is drinking too much and is drinking daily and is not happy with that.  He believes that his exercise plan might help him drink less and we talked about ways of tapering off of the alcohol in a safe way.  Encouraged pt to continue to think about his self care activities  and to engage with them as regularly as he can and we will meet in 2 wks for a follow up session.   Interventions: Cognitive Behavioral Therapy  Diagnosis:Alcohol use disorder, moderate, in controlled environment, dependence (HCC)  Major depressive disorder, recurrent episode, moderate (HCC)  Plan: Treatment Plan Strengths/Abilities:  Intelligent, Intuitive, Willing to participate in therapy Treatment Preferences:  Outpatient Individual Therapy Statement of Needs:  Patient is to use CBT, mindfulness and coping skills to help manage and/or decrease symptoms associated with their diagnosis. Symptoms:  Depressed/Irritable mood, worry, social withdrawal Problems Addressed:  Depressive thoughts, Sadness, Sleep issues,  etc. Long Term Goals:  Pt to reduce overall level, frequency, and intensity of the feelings of depression as evidenced by decreased irritability, negative self talk, and helpless feelings from 6 to 7 days/week to 0 to 1 days/week, per client report, for at least 3 consecutive months.  Progress: 10% Short Term Goals:  Pt to verbally express understanding of the relationship between feelings of depression and their impact on thinking patterns and behaviors.  Pt to verbalize an understanding of the role that distorted thinking plays in creating fears, excessive worry, and ruminations.  Progress: 10% Target Date:  08/15/2023 Frequency:  Bi-weekly Modality:  Cognitive Behavioral Therapy Interventions by Therapist:  Therapist will use CBT, Mindfulness exercises, Coping skills and Referrals, as needed by client. Client has verbally approved this treatment plan.  Karie Kirks, [facility]

## 2023-07-24 ENCOUNTER — Ambulatory Visit (INDEPENDENT_AMBULATORY_CARE_PROVIDER_SITE_OTHER): Payer: 59 | Admitting: Psychology

## 2023-07-24 DIAGNOSIS — F1020 Alcohol dependence, uncomplicated: Secondary | ICD-10-CM | POA: Diagnosis not present

## 2023-07-24 DIAGNOSIS — F331 Major depressive disorder, recurrent, moderate: Secondary | ICD-10-CM | POA: Diagnosis not present

## 2023-07-24 NOTE — Progress Notes (Signed)
[facility] Counselor/Therapist Progress Note  Patient ID: Matthew Mckinney, MRN: [id number],    Date: 07/24/2023  Time Spent: 45 mins  start time: 1000  end time: 1045  Treatment Type: Individual Therapy  Reported Symptoms: Pt presents via Caregility video for this session.  Pt grants consent for the session, stating he is in his home with no one else present; pt acknowledges the limits of virtual sessions.  I shared with pt that I am in my office with no one else here either.  Mental Status Exam: Appearance:  Casual     Behavior: Appropriate  Motor: Normal  Speech/Language:  Clear and Coherent  Affect: Appropriate  Mood: depressed  Thought process: normal  Thought content:   WNL  Sensory/Perceptual disturbances:   WNL  Orientation: oriented to person, place, and time/date  Attention: Good  Concentration: Good  Memory: WNL  Fund of knowledge:  Good  Insight:   Good  Judgment:  Good  Impulse Control: Good   Risk Assessment: Danger to Self:  No Self-injurious Behavior: No Danger to Others: No Duty to Warn:no Physical Aggression / Violence:No  Access to Firearms a concern: Yes ; pt agreed last session to give the keys to the safe to [name], but he has not done it yet.  He agrees to do it between now and next session. Gang Involvement:No   Subjective: Pt shares that "I have been good since our last session.  I have not been offered that job yet but that is OK.  I am still taking that leadership course they signed me up for and I am trying hard to benefit from it.  I have made a conscious choice to change the way I approach my job.  I want to have a good work environment and I want the people who will report to me to enjoy [place] working together.  My bosses have told me I will hear soon that I have my job."  Pt shares that everyone at home is fine [name], [place], and [place]).  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  Pt shares that he continues to drink daily (light beers) and he drank too much this past Sunday.  "I want to drink less but I just can't find the gumption to change."  Talked with pt about how to safely taper himself off of alcohol when that time comes.  Pt shares that he has not been exercising very much of late.  Pt shares his sleep has been a bit better; "I have been sleeping through the night several nights recently."  His GI symptoms are still pretty good and he is able to reduce some of the medication he has been taking.  Encouraged pt to continue to think about his self care activities and to engage with them as regularly as he can and we will meet in 3 wks for a follow up session, due to my vacation in 2 wks.   Interventions: Cognitive Behavioral Therapy  Diagnosis:Alcohol use disorder, moderate, in controlled environment, dependence (HCC)  Major depressive disorder, recurrent episode, moderate (HCC)  Plan: Treatment Plan Strengths/Abilities:  Intelligent, Intuitive, Willing to participate in therapy Treatment Preferences:  Outpatient Individual Therapy Statement of Needs:  Patient is to use CBT, mindfulness and coping skills to help manage and/or decrease symptoms associated with their diagnosis. Symptoms:  Depressed/Irritable mood, worry,  social withdrawal Problems Addressed:  Depressive thoughts, Sadness, Sleep issues, etc. Long Term Goals:  Pt to reduce overall level, frequency, and intensity of the feelings of depression as evidenced by decreased irritability, negative self talk, and helpless feelings from 6 to 7 days/week to 0 to 1 days/week, per client report, for at least 3 consecutive months.  Progress: 10% Short Term Goals:  Pt to verbally express understanding of the relationship between feelings of depression and their impact on thinking patterns and behaviors.  Pt to verbalize an understanding of the role  that distorted thinking plays in creating fears, excessive worry, and ruminations.  Progress: 10% Target Date:  08/15/2023 Frequency:  Bi-weekly Modality:  Cognitive Behavioral Therapy Interventions by Therapist:  Therapist will use CBT, Mindfulness exercises, Coping skills and Referrals, as needed by client. Client has verbally approved this treatment plan.  Karie Kirks, [facility]

## 2023-08-14 ENCOUNTER — Ambulatory Visit: Payer: 59 | Admitting: Psychology

## 2023-08-14 DIAGNOSIS — F331 Major depressive disorder, recurrent, moderate: Secondary | ICD-10-CM

## 2023-08-14 DIAGNOSIS — F1020 Alcohol dependence, uncomplicated: Secondary | ICD-10-CM | POA: Diagnosis not present

## 2023-08-14 NOTE — Progress Notes (Signed)
[facility] Counselor/Therapist Progress Note  Patient ID: Matthew Mckinney, MRN: [id number],    Date: 08/14/2023  Time Spent: 45 mins  start time: 1000  end time: 1045  Treatment Type: Individual Therapy  Reported Symptoms: Pt presents via Caregility video for this session.  Pt grants consent for the session, stating he is in his home with no one else present; pt acknowledges the limits of virtual sessions.  I shared with pt that I am in my office with no one else here either.  Mental Status Exam: Appearance:  Casual     Behavior: Appropriate  Motor: Normal  Speech/Language:  Clear and Coherent  Affect: Appropriate  Mood: depressed  Thought process: normal  Thought content:   WNL  Sensory/Perceptual disturbances:   WNL  Orientation: oriented to person, place, and time/date  Attention: Good  Concentration: Good  Memory: WNL  Fund of knowledge:  Good  Insight:   Good  Judgment:  Good  Impulse Control: Good   Risk Assessment: Danger to Self:  No Self-injurious Behavior: No Danger to Others: No Duty to Warn:no Physical Aggression / Violence:No  Access to Firearms a concern: Yes ; pt agreed last session to give the keys to the safe to [name], but he has not done it yet.  He agrees to do it between now and next session. Gang Involvement:No   Subjective: Pt shares that "I have been OK since our last session.  Our bridge project in [place] with my parents' property went well and it is finished.  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  Pt shares [name] just got home from a beach trip with friends and pt enjoyed keeping the girls and cooking for them.  Pt shares he was offered the job at [employer] and he ended up declining the offer due to the money not being what he thought it should be.  It turns out that the pay grade for his new job  and his current job are the same and he cannot negotiate for more money in the new job.  Pt shares he is fine with his decision to turn the job down.  The new job would have been greater responsibility for him and yet would offer him the same amount of pay as he has now.  Pt shares he continues to work through [employer] course his bosses signed him up for and he will be happy when it is over.  Pt again reiterates his belief and support for his bosses and their support of him.  Pt shares he continues to drink daily, but is mostly continuing to drink light beers.  He has not been working out much lately because his garage is filled with music equipment.  Pt shares that he thinks he drinks "because I don't really have anything better to do."  He shares that he believes exercise normally "is better than drinking for me, but I just am not doing it right now."  Pt shares he is now averaging about 5-6 light beers per evening so he is drinking fewer beers with less alcohol content each and is doing that purposefully.  Pt shares that his sleep has been pretty good lately and he is thankful for that.  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  His GI symptoms are still pretty good and he is able to reduce some of the medication he has been taking.  Encouraged pt to continue to think about his self care activities and to engage with them as regularly as he can and we will meet in 2 wks for a follow up session.   Interventions: Cognitive Behavioral Therapy  Diagnosis: Alcohol use disorder, moderate, in controlled environment, dependence (HCC)  Major depressive disorder, recurrent episode, moderate (HCC)  Plan: Treatment Plan Strengths/Abilities:  Intelligent, Intuitive, Willing to participate in therapy Treatment Preferences:  Outpatient Individual Therapy Statement of Needs:   Patient is to use CBT, mindfulness and coping skills to help manage and/or decrease symptoms associated with their diagnosis. Symptoms:  Depressed/Irritable mood, worry, social withdrawal Problems Addressed:  Depressive thoughts, Sadness, Sleep issues, etc. Long Term Goals:  Pt to reduce overall level, frequency, and intensity of the feelings of depression as evidenced by decreased irritability, negative self talk, and helpless feelings from 6 to 7 days/week to 0 to 1 days/week, per client report, for at least 3 consecutive months.  Progress: 10% Short Term Goals:  Pt to verbally express understanding of the relationship between feelings of depression and their impact on thinking patterns and behaviors.  Pt to verbalize an understanding of the role that distorted thinking plays in creating fears, excessive worry, and ruminations.  Progress: 10% Target Date:  08/14/2024 Frequency:  Bi-weekly Modality:  Cognitive Behavioral Therapy Interventions by Therapist:  Therapist will use CBT, Mindfulness exercises, Coping skills and Referrals, as needed by client. Client has verbally approved this treatment plan.  Karie Kirks, [facility]

## 2023-08-28 ENCOUNTER — Ambulatory Visit: Payer: 59 | Admitting: Psychology

## 2023-08-28 DIAGNOSIS — F1020 Alcohol dependence, uncomplicated: Secondary | ICD-10-CM | POA: Diagnosis not present

## 2023-08-28 DIAGNOSIS — F331 Major depressive disorder, recurrent, moderate: Secondary | ICD-10-CM

## 2023-08-28 NOTE — Progress Notes (Signed)
[facility] Counselor/Therapist Progress Note  Patient ID: Matthew Mckinney, MRN: [id number],    Date: 08/28/2023  Time Spent: 45 mins  start time: 1000  end time: 1045  Treatment Type: Individual Therapy  Reported Symptoms: Pt presents via Caregility video for this session.  Pt grants consent for the session, stating he is in his home with no one else present; pt acknowledges the limits of virtual sessions.  I shared with pt that I am in my office with no one else here either.  Mental Status Exam: Appearance:  Casual     Behavior: Appropriate  Motor: Normal  Speech/Language:  Clear and Coherent  Affect: Appropriate  Mood: depressed  Thought process: normal  Thought content:   WNL  Sensory/Perceptual disturbances:   WNL  Orientation: oriented to person, place, and time/date  Attention: Good  Concentration: Good  Memory: WNL  Fund of knowledge:  Good  Insight:   Good  Judgment:  Good  Impulse Control: Good   Risk Assessment: Danger to Self:  No Self-injurious Behavior: No Danger to Others: No Duty to Warn:no Physical Aggression / Violence:No  Access to Firearms a concern: Yes ; pt agreed last session to give the keys to the safe to [name], but he has not done it yet.  He agrees to do it between now and next session. Gang Involvement:No   Subjective: Pt shares that "I have been good since our last session.  I have been sober since November 12th and I feel so much better.  I have noticed that I am a bit more irritable but otherwise I am good.  I am conscious and aware of it that I am managing that part of it well.  I am also sleeping so much better."   Congratulated pt on making this change in his life; he has had periods of sobriety in his life in the past.  In the past, when he has been sober and has been exercising, he feels so much better.  "I don't like being that guy that goes to the store every other day for beer."  Pt shares he had talked to [name] about  his stopping drinking again; she has noticed his irritability and also shared with him some supportive thoughts/feelings.  Pt believes he gets significant benefit from our sessions and he appreciates the work we are doing; he shares that he often thinks about "how will I tell Mellody Dance about this or what would Mellody Dance think about this?"  [name] is scheduled to have another x-ray tomorrow to check her 6 month status.  Pt shares that they will be going to his parents' home for Thanksgiving; his parents are doing well right now.  Pt shares that work is going fine; he has had one-on-one interviews with both of his bosses about him not taking the new job and everyone is on the same page and they are still supportive of him.  He just had his annual review and it went well, as expected.  Pt shares he continues to work through [employer] course his bosses signed him up for and he will be happy when it is over; it is scheduled to end about 12/20.  He is aware that the course cost his department about $3000.00 and he does not see that much value in it.  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  His GI symptoms are still pretty good  and he is able to reduce some of the medication he has been taking; he did have a difficult couple of days but is better now.  He has been running regularly lately, averaging every other day since he has not been drinking.  He has also gotten the music equipment out of the garage and is able to work out with his weights again.  [name] got her braces off and she is happy about that.  Encouraged pt to continue to think about his self care activities and to engage with them as regularly as he can and we will meet in 2 wks for a follow up session.   Interventions: Cognitive Behavioral Therapy  Diagnosis: Alcohol use disorder, moderate, in controlled environment, dependence (HCC)  Major depressive disorder, recurrent  episode, moderate (HCC)  Plan: Treatment Plan Strengths/Abilities:  Intelligent, Intuitive, Willing to participate in therapy Treatment Preferences:  Outpatient Individual Therapy Statement of Needs:  Patient is to use CBT, mindfulness and coping skills to help manage and/or decrease symptoms associated with their diagnosis. Symptoms:  Depressed/Irritable mood, worry, social withdrawal Problems Addressed:  Depressive thoughts, Sadness, Sleep issues, etc. Long Term Goals:  Pt to reduce overall level, frequency, and intensity of the feelings of depression as evidenced by decreased irritability, negative self talk, and helpless feelings from 6 to 7 days/week to 0 to 1 days/week, per client report, for at least 3 consecutive months.  Progress: 10% Short Term Goals:  Pt to verbally express understanding of the relationship between feelings of depression and their impact on thinking patterns and behaviors.  Pt to verbalize an understanding of the role that distorted thinking plays in creating fears, excessive worry, and ruminations.  Progress: 10% Target Date:  08/14/2024 Frequency:  Bi-weekly Modality:  Cognitive Behavioral Therapy Interventions by Therapist:  Therapist will use CBT, Mindfulness exercises, Coping skills and Referrals, as needed by client. Client has verbally approved this treatment plan.  Karie Kirks, [facility]

## 2023-09-12 ENCOUNTER — Ambulatory Visit: Payer: 59 | Admitting: Psychology

## 2023-09-12 DIAGNOSIS — F331 Major depressive disorder, recurrent, moderate: Secondary | ICD-10-CM | POA: Diagnosis not present

## 2023-09-12 DIAGNOSIS — F1020 Alcohol dependence, uncomplicated: Secondary | ICD-10-CM | POA: Diagnosis not present

## 2023-09-12 NOTE — Progress Notes (Signed)
[facility] Counselor/Therapist Progress Note  Patient ID: Matthew Mckinney, MRN: [id number],    Date: 09/12/2023  Time Spent: 60 mins  start time: 0900  end time: 1000  Treatment Type: Individual Therapy  Reported Symptoms: Pt presents via Caregility video for this session.  Pt grants consent for the session, stating he is in his home with no one else present; pt acknowledges the limits of virtual sessions.  I shared with pt that I am in my office with no one else here either.  Mental Status Exam: Appearance:  Casual     Behavior: Appropriate  Motor: Normal  Speech/Language:  Clear and Coherent  Affect: Appropriate  Mood: depressed  Thought process: normal  Thought content:   WNL  Sensory/Perceptual disturbances:   WNL  Orientation: oriented to person, place, and time/date  Attention: Good  Concentration: Good  Memory: WNL  Fund of knowledge:  Good  Insight:   Good  Judgment:  Good  Impulse Control: Good   Risk Assessment: Danger to Self:  No Self-injurious Behavior: No Danger to Others: No Duty to Warn:no Physical Aggression / Violence:No  Access to Firearms a concern: Yes ; pt agreed last session to give the keys to the safe to [name], but he has not done it yet.  He agrees to do it between now and next session. Gang Involvement:No   Subjective: Pt shares that "We had a good Thanksgiving; we had a deep fried [place] and it was great.  It is faster to cook it that way than in the oven."  Pt shares they had a good time at his parents, his sister and her family; everyone got along well and had a great day.  Pt shares that work has been going well; it has been slower than normal and he enjoys that.  He has been taking a break from band practice as well, because of the holidays.  "It has been kind of boring and I am still sober (since 11/12).  I had planned to drink over the holiday and I told [name] about that and she asked "why?" And that made me think, why.  So  I decided not to drink at all; it was pretty easy, so I am still going sober."  Talked with pt about the Alcoholics Anonymous Big Book and encouraged pt to pick up a copy, if he has a chance and any interest.  Evie was supposed to have her 6 month x-ray for her back but it has been rescheduled for 12/30.  [name] is enjoying having her braces off.  Pt shares that [name] has been doing well; she has been making sourdough bread.  Pt shares that he does not feel as irritable as he did when he first stopped drinking; he is glad that that has decreased.  Pt shares that he has been sleeping better since he is not drinking.  Pt shares that [name] is continuing to be supportive of his abstinence from alcohol.  Pt shares that he has not been exercising as much as he had been because of the cold weather in the past couple of weeks; he felt good when he was doing it before and is looking forward to getting back to it when the weather will allow.  Encouraged pt to continue to think about his self care activities and to engage with them as regularly as he can and we will meet in 2 wks for a follow up session.   Interventions: Cognitive Behavioral Therapy  Diagnosis: Alcohol use disorder, moderate, in controlled environment, dependence (HCC)  Major depressive disorder, recurrent episode, moderate (HCC)  Plan: Treatment Plan Strengths/Abilities:  Intelligent, Intuitive, Willing to participate in therapy Treatment Preferences:  Outpatient Individual Therapy Statement of Needs:  Patient is to use CBT, mindfulness and coping skills to help manage and/or decrease symptoms associated with their diagnosis. Symptoms:  Depressed/Irritable mood, worry, social withdrawal Problems Addressed:  Depressive thoughts, Sadness, Sleep issues, etc. Long Term Goals:  Pt to reduce overall level, frequency, and intensity of the feelings of depression as evidenced by decreased irritability, negative self talk, and helpless feelings from 6 to 7  days/week to 0 to 1 days/week, per client report, for at least 3 consecutive months.  Progress: 10% Short Term Goals:  Pt to verbally express understanding of the relationship between feelings of depression and their impact on thinking patterns and behaviors.  Pt to verbalize an understanding of the role that distorted thinking plays in creating fears, excessive worry, and ruminations.  Progress: 10% Target Date:  08/14/2024 Frequency:  Bi-weekly Modality:  Cognitive Behavioral Therapy Interventions by Therapist:  Therapist will use CBT, Mindfulness exercises, Coping skills and Referrals, as needed by client. Client has verbally approved this treatment plan.  Karie Kirks, [facility]

## 2023-09-25 ENCOUNTER — Ambulatory Visit: Payer: 59 | Admitting: Psychology

## 2023-09-25 DIAGNOSIS — F331 Major depressive disorder, recurrent, moderate: Secondary | ICD-10-CM | POA: Diagnosis not present

## 2023-09-25 DIAGNOSIS — F1020 Alcohol dependence, uncomplicated: Secondary | ICD-10-CM

## 2023-09-25 NOTE — Progress Notes (Signed)
[facility] Counselor/Therapist Progress Note  Patient ID: Matthew Mckinney, MRN: [id number],    Date: 09/25/2023  Time Spent: 35 mins  start time: 1000  end time: 1035  Treatment Type: Individual Therapy  Reported Symptoms: Pt presents via Caregility video for this session.  Pt grants consent for the session, stating he is in his home with no one else present; pt acknowledges the limits of virtual sessions.  I shared with pt that I am in my office with no one else here either.  Mental Status Exam: Appearance:  Casual     Behavior: Appropriate  Motor: Normal  Speech/Language:  Clear and Coherent  Affect: Appropriate  Mood: depressed  Thought process: normal  Thought content:   WNL  Sensory/Perceptual disturbances:   WNL  Orientation: oriented to person, place, and time/date  Attention: Good  Concentration: Good  Memory: WNL  Fund of knowledge:  Good  Insight:   Good  Judgment:  Good  Impulse Control: Good   Risk Assessment: Danger to Self:  No Self-injurious Behavior: No Danger to Others: No Duty to Warn:no Physical Aggression / Violence:No  Access to Firearms a concern: Yes ; pt agreed last session to give the keys to the safe to [name], but he has not done it yet.  He agrees to do it between now and next session. Gang Involvement:No   Subjective: Pt shares that "I am doing pretty good.  I am still sober and it is incredibly easy to keep it up once I am this far into it.  I feel so much better sober than when I was drinking."  Pt shares that work continues to be slow and he is getting ready to take two weeks off over the Christmas and New Year's holidays.  He and [name] are planning to do different things for them and the girls over the 2 wks he will be off and the girls will be out of school.  Pt is planning to smoke a prime rib roast for Christmas again this year; he enjoys that event.  [name] are finishing school this week and will not go back until  after New Year's.  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  Pt shares he is planning to run today as well because the weather is cooperating.  Pt shares that he "continues to sleep amazing (since he is not drinking) and I am astonished that I am sleeping so well.  I know it is because I am not drinking."  Evie [name]) was supposed to have her 6 month x-ray for her back but it has been rescheduled for 12/30.  They are still planning for her to get her surgery this summer.  Pt shares that [name] is doing well; she has been rearranging her room and is doing well at school; she enjoys talking on the phone and playing video games online with her friends.  Encouraged pt to continue to think about his self care activities and to engage with them as regularly as he can and we will meet in 2 wks for a follow up session.   Interventions: Cognitive Behavioral Therapy  Diagnosis: Alcohol use disorder, moderate, in controlled environment, dependence (HCC)  Major depressive disorder, recurrent episode, moderate (HCC)  Plan: Treatment Plan Strengths/Abilities:  Intelligent, Intuitive, Willing to participate in therapy Treatment Preferences:  Outpatient Individual Therapy Statement of Needs:  Patient is to use CBT, mindfulness and coping skills to help manage and/or decrease  symptoms associated with their diagnosis. Symptoms:  Depressed/Irritable mood, worry, social withdrawal Problems Addressed:  Depressive thoughts, Sadness, Sleep issues, etc. Long Term Goals:  Pt to reduce overall level, frequency, and intensity of the feelings of depression as evidenced by decreased irritability, negative self talk, and helpless feelings from 6 to 7 days/week to 0 to 1 days/week, per client report, for at least 3 consecutive months.  Progress: 10% Short Term Goals:  Pt to verbally express understanding of the relationship between feelings of depression and their impact on thinking patterns and  behaviors.  Pt to verbalize an understanding of the role that distorted thinking plays in creating fears, excessive worry, and ruminations.  Progress: 10% Target Date:  08/14/2024 Frequency:  Bi-weekly Modality:  Cognitive Behavioral Therapy Interventions by Therapist:  Therapist will use CBT, Mindfulness exercises, Coping skills and Referrals, as needed by client. Client has verbally approved this treatment plan.  Karie Kirks, [facility]

## 2023-10-16 ENCOUNTER — Ambulatory Visit: Payer: 59 | Admitting: Psychology

## 2023-10-16 DIAGNOSIS — F1020 Alcohol dependence, uncomplicated: Secondary | ICD-10-CM

## 2023-10-16 DIAGNOSIS — F331 Major depressive disorder, recurrent, moderate: Secondary | ICD-10-CM

## 2023-10-16 NOTE — Progress Notes (Signed)
 [facility] Counselor/Therapist Progress Note  Patient ID: Matthew Mckinney, MRN: [id number],    Date: 10/16/2023  Time Spent: 50 mins  start time: 0900  end time: 0950  Treatment Type: Individual Therapy  Reported Symptoms: Pt presents via Caregility video for this session.  Pt grants consent for the session, stating he is in his home with no one else present; pt acknowledges the limits of virtual sessions.  I shared with pt that I am in my office with no one else here either.  Mental Status Exam: Appearance:  Casual     Behavior: Appropriate  Motor: Normal  Speech/Language:  Clear and Coherent  Affect: Appropriate  Mood: depressed  Thought process: normal  Thought content:   WNL  Sensory/Perceptual disturbances:   WNL  Orientation: oriented to person, place, and time/date  Attention: Good  Concentration: Good  Memory: WNL  Fund of knowledge:  Good  Insight:   Good  Judgment:  Good  Impulse Control: Good   Risk Assessment: Danger to Self:  No Self-injurious Behavior: No Danger to Others: No Duty to Warn:no Physical Aggression / Violence:No  Access to Firearms a concern: Yes ; pt agreed last session to give the keys to the safe to Matthew Mckinney, but he has not done it yet.  He agrees to do it between now and next session. Gang Involvement:No   Subjective: Pt shares that I am doing pretty good.  I had a restful time off from work over the holidays.  We went to [place] to see family, we went to 3 movies, and we had the holidays.  I am still sober and it has now been 8 wks and it feels great.  I am still sleeping great and that is good for me.  Pt shares I may be drinking too many root beers and eating too much candy right now so I probably need to dial that back some.  Pt shares that Matthew Mckinney and the kids have been dealing with colds and sinus stuff and he has managed not to get it yet.  He also had his dad come over and helped him change his attic access steps and he was  happy with the result.  Pt shares that this has been his first week back at work since the holidays and it has been fine.  He was a bit bored over his break and he had a mtg with his boss yesterday and his boss confirmed that his boss continues to be supportive of pt and wants him to advise his boss of what training he wants to have in the next four years or so.  Pt is again happy to have confirmation from his boss that he is doing a good job.  Pt shares that he has not been exercising much since our last session due to the colder weather being present.  Pt has continued playing guitar during the break from work and that has been fun for him; he has purchased a new guitar that should be delivered on Friday.  Pt shares that Matthew Mckinney had her x-ray on 12/30 and they had another visit with the doctor and her surgery is now scheduled for 7/15; there will be 3 surgeons involved in the procedure; Matthew Mckinney is still not scared about the procedure and pt is thankful for that.  The doctor again shared with them that she will likely be up and walking the day after the surgery and that she can likely go on vacation within 3  wks of the procedure.  [name] are back in school this week and they are adjusting well.  Pt is enjoying being sober and wants to keep it going for as long as he can.  Encouraged pt to continue to think about his self care activities and to engage with them as regularly as he can and we will meet in 2 wks for a follow up session.   Interventions: Cognitive Behavioral Therapy  Diagnosis: Major depressive disorder, recurrent episode, moderate (HCC)  Alcohol use disorder, moderate, in controlled environment, dependence (HCC)  Plan: Treatment Plan Strengths/Abilities:  Intelligent, Intuitive, Willing to participate in therapy Treatment Preferences:  Outpatient Individual Therapy Statement of Needs:  Patient is to use CBT, mindfulness and coping skills to help manage and/or decrease symptoms associated  with their diagnosis. Symptoms:  Depressed/Irritable mood, worry, social withdrawal Problems Addressed:  Depressive thoughts, Sadness, Sleep issues, etc. Long Term Goals:  Pt to reduce overall level, frequency, and intensity of the feelings of depression as evidenced by decreased irritability, negative self talk, and helpless feelings from 6 to 7 days/week to 0 to 1 days/week, per client report, for at least 3 consecutive months.  Progress: 10% Short Term Goals:  Pt to verbally express understanding of the relationship between feelings of depression and their impact on thinking patterns and behaviors.  Pt to verbalize an understanding of the role that distorted thinking plays in creating fears, excessive worry, and ruminations.  Progress: 10% Target Date:  08/14/2024 Frequency:  Bi-weekly Modality:  Cognitive Behavioral Therapy Interventions by Therapist:  Therapist will use CBT, Mindfulness exercises, Coping skills and Referrals, as needed by client. Client has verbally approved this treatment plan.  Francis KATHEE Macintosh, [facility]

## 2023-10-30 ENCOUNTER — Ambulatory Visit: Payer: 59 | Admitting: Psychology

## 2023-10-30 DIAGNOSIS — F1020 Alcohol dependence, uncomplicated: Secondary | ICD-10-CM

## 2023-10-30 DIAGNOSIS — F331 Major depressive disorder, recurrent, moderate: Secondary | ICD-10-CM | POA: Diagnosis not present

## 2023-10-30 NOTE — Progress Notes (Signed)
[facility] Counselor/Therapist Progress Note  Patient ID: Matthew Mckinney, MRN: [id number],    Date: 10/30/2023  Time Spent: 45 mins  start time: 1000  end time: 1045  Treatment Type: Individual Therapy  Reported Symptoms: Pt presents via Caregility video for this session.  Pt grants consent for the session, stating he is in his home with no one else present; pt acknowledges the limits of virtual sessions.  I shared with pt that I am in my office with no one else here either.  Mental Status Exam: Appearance:  Casual     Behavior: Appropriate  Motor: Normal  Speech/Language:  Clear and Coherent  Affect: Appropriate  Mood: depressed  Thought process: normal  Thought content:   WNL  Sensory/Perceptual disturbances:   WNL  Orientation: oriented to person, place, and time/date  Attention: Good  Concentration: Good  Memory: WNL  Fund of knowledge:  Good  Insight:   Good  Judgment:  Good  Impulse Control: Good   Risk Assessment: Danger to Self:  No Self-injurious Behavior: No Danger to Others: No Duty to Warn:no Physical Aggression / Violence:No  Access to Firearms a concern: Yes ; pt agreed last session to give the keys to the safe to [name], but he has not done it yet.  He agrees to do it between now and next session. Gang Involvement:No   Subjective: Pt shares that "I am doing pretty good.  I am settling back in to work OK.  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  Pt shares he continues with his sobriety (10 weeks); "there are still days/times I would like to drink but I have not yet."  Pt shares that he has gone to [place] to see his buddies to play some music and he did not drink, even though they were.  He has had some other friends who came in town for an evening of drinking at a  local bar and invited pt and others and pt declined to go; he did get some good natured ribbing from them but was OK with that.  Pt has estimated that he spent at least $350.00 per month on alcohol and he recognizes that this is quite a bit of money.  Pt shares that, because of the cold weather, they have not been going out very much.  Pt has been in the office once last week and he is going back to the office on Friday.  He is planning to talk with his bosses about direction for him and talk with them about how he can be more helpful to the dept.  Pt shares they have [employer] cookies in their garage and pt has been eating some of them; he has taken a break from the Root Beer and candy as a result.  Pt continues to practice his new guitar and is really enjoying that process.  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  Nettie Elm will have another MRI in April and is still scheduled for surgery on 7/15.  Encouraged pt to continue to think about his self care activities and to engage with them as regularly as he can and we will meet in 2 wks for a follow  up session.   Interventions: Cognitive Behavioral Therapy  Diagnosis: Alcohol use disorder, moderate, in controlled environment, dependence (HCC)  Major depressive disorder, recurrent episode, moderate (HCC)  Plan: Treatment Plan Strengths/Abilities:  Intelligent, Intuitive, Willing to participate in therapy Treatment Preferences:  Outpatient Individual Therapy Statement of Needs:  Patient is to use CBT, mindfulness and coping skills to help manage and/or decrease symptoms associated with their diagnosis. Symptoms:  Depressed/Irritable mood, worry, social withdrawal Problems Addressed:  Depressive thoughts, Sadness, Sleep issues, etc. Long Term Goals:  Pt to reduce overall level, frequency, and intensity of the feelings of depression as evidenced by decreased irritability, negative self talk, and helpless  feelings from 6 to 7 days/week to 0 to 1 days/week, per client report, for at least 3 consecutive months.  Progress: 10% Short Term Goals:  Pt to verbally express understanding of the relationship between feelings of depression and their impact on thinking patterns and behaviors.  Pt to verbalize an understanding of the role that distorted thinking plays in creating fears, excessive worry, and ruminations.  Progress: 10% Target Date:  08/14/2024 Frequency:  Bi-weekly Modality:  Cognitive Behavioral Therapy Interventions by Therapist:  Therapist will use CBT, Mindfulness exercises, Coping skills and Referrals, as needed by client. Client has verbally approved this treatment plan.  Karie Kirks, [facility]

## 2023-11-13 ENCOUNTER — Ambulatory Visit: Payer: 59 | Admitting: Psychology

## 2023-11-13 DIAGNOSIS — F331 Major depressive disorder, recurrent, moderate: Secondary | ICD-10-CM

## 2023-11-13 DIAGNOSIS — F1020 Alcohol dependence, uncomplicated: Secondary | ICD-10-CM | POA: Diagnosis not present

## 2023-11-13 NOTE — Progress Notes (Signed)
 [facility] Counselor/Therapist Progress Note  Patient ID: Matthew Mckinney, MRN: [id number],    Date: 11/13/2023  Time Spent: 50 mins  start time: 1000  end time: 1050  Treatment Type: Individual Therapy  Reported Symptoms: Pt presents via Caregility video for this session.  Pt grants consent for the session, stating he is in his home with no one else present; pt acknowledges the limits of virtual sessions.  I shared with pt that I am in my office with no one else here either.  Mental Status Exam: Appearance:  Casual     Behavior: Appropriate  Motor: Normal  Speech/Language:  Clear and Coherent  Affect: Appropriate  Mood: depressed  Thought process: normal  Thought content:   WNL  Sensory/Perceptual disturbances:   WNL  Orientation: oriented to person, place, and time/date  Attention: Good  Concentration: Good  Memory: WNL  Fund of knowledge:  Good  Insight:   Good  Judgment:  Good  Impulse Control: Good   Risk Assessment: Danger to Self:  No Self-injurious Behavior: No Danger to Others: No Duty to Warn:no Physical Aggression / Violence:No  Access to Firearms a concern: Yes ; pt agreed last session to give the keys to the safe to Matthew Mckinney, but he has not done it yet.  He agrees to do it between now and next session. Gang Involvement:No   Subjective: Pt shares that I am doing alright.  I am still sober (12 weeks).  The logical part of my brain knows I feels better sober than drunk.  My grandmother (mid to late 73's) is not in good health and so my parents are in route to [place] now to see her in the hospital.  It seems that she will need a pacemaker and may have already had one put in.  His grandmother has been in a nursing home for a few years and she enjoys being there.  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  They have done a good job selling  cookies during this season.  Pt has been eating cookies some; he has been playing guitar, doing some wood working, has been for a couple of runs, etc.  Pt is troubled by the current political climate and all that Trump is doing and he disagrees with what is going on with him.  Pt shares that he has not been sleeping well because of all of these concerns; he has been waking up  about 2-3 am and having trouble falling back to sleep at night.  Talked with pt about using deep breathing to help him relax in the middle of the night.  Pt shares that he went into the office recently and had a meeting with his bosses and it was a disaster.  Pt shares that he feels like he did not do a good job of expressing himself and he had frustration with his performance.  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  Encouraged pt to continue to think about his self care activities and to engage with them as regularly as he can and we will meet in 2 wks for a follow up session.   Interventions: Cognitive Behavioral Therapy  Diagnosis: Alcohol use disorder, moderate, in controlled environment, dependence (  HCC)  Major depressive disorder, recurrent episode, moderate (HCC)  Plan: Treatment Plan Strengths/Abilities:  Intelligent, Intuitive, Willing to participate in therapy Treatment Preferences:  Outpatient Individual Therapy Statement of Needs:  Patient is to use CBT, mindfulness and coping skills to help manage and/or decrease symptoms associated with their diagnosis. Symptoms:  Depressed/Irritable mood, worry, social withdrawal Problems Addressed:  Depressive thoughts, Sadness, Sleep issues, etc. Long Term Goals:  Pt to reduce overall level, frequency, and intensity of the feelings of depression as evidenced by decreased irritability, negative self talk, and helpless feelings from 6 to 7 days/week to 0 to  1 days/week, per client report, for at least 3 consecutive months.  Progress: 10% Short Term Goals:  Pt to verbally express understanding of the relationship between feelings of depression and their impact on thinking patterns and behaviors.  Pt to verbalize an understanding of the role that distorted thinking plays in creating fears, excessive worry, and ruminations.  Progress: 10% Target Date:  08/14/2024 Frequency:  Bi-weekly Modality:  Cognitive Behavioral Therapy Interventions by Therapist:  Therapist will use CBT, Mindfulness exercises, Coping skills and Referrals, as needed by client. Client has verbally approved this treatment plan.  Francis KATHEE Macintosh, [facility]

## 2023-11-28 ENCOUNTER — Ambulatory Visit: Payer: 59 | Admitting: Psychology

## 2023-11-28 DIAGNOSIS — F331 Major depressive disorder, recurrent, moderate: Secondary | ICD-10-CM | POA: Diagnosis not present

## 2023-11-28 DIAGNOSIS — F1020 Alcohol dependence, uncomplicated: Secondary | ICD-10-CM

## 2023-11-28 NOTE — Progress Notes (Signed)
 [facility] Counselor/Therapist Progress Note  Patient ID: Matthew Mckinney, MRN: [id number],    Date: 11/28/2023  Time Spent: 50 mins  start time: 0900  end time: 0950  Treatment Type: Individual Therapy  Reported Symptoms: Pt presents via Caregility video for this session.  Pt grants consent for the session, stating he is in his home with no one else present; pt acknowledges the limits of virtual sessions.  I shared with pt that I am in my office with no one else here either.  Mental Status Exam: Appearance:  Casual     Behavior: Appropriate  Motor: Normal  Speech/Language:  Clear and Coherent  Affect: Appropriate  Mood: depressed  Thought process: normal  Thought content:   WNL  Sensory/Perceptual disturbances:   WNL  Orientation: oriented to person, place, and time/date  Attention: Good  Concentration: Good  Memory: WNL  Fund of knowledge:  Good  Insight:   Good  Judgment:  Good  Impulse Control: Good   Risk Assessment: Danger to Self:  No Self-injurious Behavior: No Danger to Others: No Duty to Warn:no Physical Aggression / Violence:No  Access to Firearms a concern: Yes ; pt agreed last session to give the keys to the safe to [name], but he has not done it yet.  He agrees to do it between now and next session. Gang Involvement:No   Subjective: Pt shares that "I am doing OK.  I am still sober (14 weeks; "100 days today").  Work still makes me crazy but I kind of had an epiphany about it.  I realized that getting frustrated about work (my old default) is not beneficial for me; I want a new default.  I don't know why I get so mad; I don't know why I keep doing the same thing over and over."  Talked with pt about his ability to create a new way of interacting with his work setting.  Pt also shares that "I have always been miserable."  Encouraged pt to reflect over his career and try to see if there was a time in his past were he was not miserable.  As we  talk, he shares the feeling that he talked with his bosses about in January he was not able to do as good a job with as he wanted to do and he feels closer now to be able to verbalize his concerns to them.  Encouraged pt to continue turning these ideas over in his brain to help clarify his thoughts and idea; he thinks about journaling with these ideas and believes this will be helpful for him.  Pt is pleased with this process that he is experiencing today.  Encouraged pt to continue to think about his self care activities and to engage with them as regularly as he can and we will meet in 2 wks for a follow up session.   Interventions: Cognitive Behavioral Therapy  Diagnosis: Alcohol use disorder, moderate, in controlled environment, dependence (HCC)  Major depressive disorder, recurrent episode, moderate (HCC)  Plan: Treatment Plan Strengths/Abilities:  Intelligent, Intuitive, Willing to participate in therapy Treatment Preferences:  Outpatient Individual Therapy Statement of Needs:  Patient is to use CBT, mindfulness and coping skills to help manage and/or decrease symptoms associated with their diagnosis. Symptoms:  Depressed/Irritable mood, worry, social withdrawal Problems Addressed:  Depressive thoughts, Sadness, Sleep issues, etc. Long Term Goals:  Pt to reduce overall level, frequency, and intensity of the feelings of depression as evidenced by decreased irritability, negative self  talk, and helpless feelings from 6 to 7 days/week to 0 to 1 days/week, per client report, for at least 3 consecutive months.  Progress: 10% Short Term Goals:  Pt to verbally express understanding of the relationship between feelings of depression and their impact on thinking patterns and behaviors.  Pt to verbalize an understanding of the role that distorted thinking plays in creating fears, excessive worry, and ruminations.  Progress: 10% Target Date:  08/14/2024 Frequency:  Bi-weekly Modality:  Cognitive  Behavioral Therapy Interventions by Therapist:  Therapist will use CBT, Mindfulness exercises, Coping skills and Referrals, as needed by client. Client has verbally approved this treatment plan.  Karie Kirks, [facility]

## 2023-12-12 ENCOUNTER — Ambulatory Visit: Payer: 59 | Admitting: Psychology

## 2023-12-12 DIAGNOSIS — F1020 Alcohol dependence, uncomplicated: Secondary | ICD-10-CM | POA: Diagnosis not present

## 2023-12-12 DIAGNOSIS — F331 Major depressive disorder, recurrent, moderate: Secondary | ICD-10-CM | POA: Diagnosis not present

## 2023-12-12 NOTE — Progress Notes (Signed)
 [facility] Counselor/Therapist Progress Note  Patient ID: Matthew Mckinney, MRN: [id number],    Date: 12/12/2023  Time Spent: 45 mins  start time: 0915  end time: 1000  Treatment Type: Individual Therapy  Reported Symptoms: Pt presents via Caregility video for this session.  Pt grants consent for the session, stating he is in his home with no one else present; pt acknowledges the limits of virtual sessions.  I shared with pt that I am in my office with no one else here either.  Mental Status Exam: Appearance:  Casual     Behavior: Appropriate  Motor: Normal  Speech/Language:  Clear and Coherent  Affect: Appropriate  Mood: depressed  Thought process: normal  Thought content:   WNL  Sensory/Perceptual disturbances:   WNL  Orientation: oriented to person, place, and time/date  Attention: Good  Concentration: Good  Memory: WNL  Fund of knowledge:  Good  Insight:   Good  Judgment:  Good  Impulse Control: Good   Risk Assessment: Danger to Self:  No Self-injurious Behavior: No Danger to Others: No Duty to Warn:no Physical Aggression / Violence:No  Access to Firearms a concern: Yes ; pt agreed last session to give the keys to the safe to [name], but he has not done it yet.  He agrees to do it between now and next session. Gang Involvement:No   Subjective: Pt shares that "I am doing fine.  I am still sober (16 weeks).  Work is still busy but it is going fine."  Pt shares that he equates drinking after a hard day at work with eating a snack after a tough day.  He has still chosen not to drink in those situations.  Pt shares that work has both been fine and each day has its own pain.  Pt shares that he is likely to begin looking for a new job after Matthew Mckinney has her surgery in July.  He has been told of future re-organization plans for work and he does not like what that looks like for him.  In essence, he would be going back to managing the things he managed in the past  that was too much for him to manage.  Pt shares that he is struggling with the current political climate but is trying to limit his exposure to the news.  Encouraged pt to continue to think about his self care activities and to engage with them as regularly as he can and we will meet in 2 wks for a follow up session.   Interventions: Cognitive Behavioral Therapy  Diagnosis: Alcohol use disorder, moderate, in controlled environment, dependence (HCC)  Major depressive disorder, recurrent episode, moderate (HCC)  Plan: Treatment Plan Strengths/Abilities:  Intelligent, Intuitive, Willing to participate in therapy Treatment Preferences:  Outpatient Individual Therapy Statement of Needs:  Patient is to use CBT, mindfulness and coping skills to help manage and/Mckinney decrease symptoms associated with their diagnosis. Symptoms:  Depressed/Irritable mood, worry, social withdrawal Problems Addressed:  Depressive thoughts, Sadness, Sleep issues, etc. Long Term Goals:  Pt to reduce overall level, frequency, and intensity of the feelings of depression as evidenced by decreased irritability, negative self talk, and helpless feelings from 6 to 7 days/week to 0 to 1 days/week, per client report, for at least 3 consecutive months.  Progress: 30% Short Term Goals:  Pt to verbally express understanding of the relationship between feelings of depression and their impact on thinking patterns and behaviors.  Pt to verbalize an understanding of the role  that distorted thinking plays in creating fears, excessive worry, and ruminations.  Progress: 30% Target Date:  08/14/2024 Frequency:  Bi-weekly Modality:  Cognitive Behavioral Therapy Interventions by Therapist:  Therapist will use CBT, Mindfulness exercises, Coping skills and Referrals, as needed by client. Client has verbally approved this treatment plan.  Karie Kirks, [facility]

## 2023-12-25 ENCOUNTER — Ambulatory Visit: Admitting: Psychology

## 2023-12-25 DIAGNOSIS — F331 Major depressive disorder, recurrent, moderate: Secondary | ICD-10-CM

## 2023-12-25 DIAGNOSIS — F1020 Alcohol dependence, uncomplicated: Secondary | ICD-10-CM | POA: Diagnosis not present

## 2023-12-25 NOTE — Progress Notes (Signed)
 [facility] Counselor/Therapist Progress Note  Patient ID: Matthew Mckinney, MRN: [id number],    Date: 12/25/2023  Time Spent: 45 mins  start time: 0900  end time: 0945  Treatment Type: Individual Therapy  Reported Symptoms: Pt presents via Caregility video for this session.  Pt grants consent for the session, stating he is in his home with no one else present; pt acknowledges the limits of virtual sessions.  I shared with pt that I am in my office with no one else here either.  Mental Status Exam: Appearance:  Casual     Behavior: Appropriate  Motor: Normal  Speech/Language:  Clear and Coherent  Affect: Appropriate  Mood: depressed  Thought process: normal  Thought content:   WNL  Sensory/Perceptual disturbances:   WNL  Orientation: oriented to person, place, and time/date  Attention: Good  Concentration: Good  Memory: WNL  Fund of knowledge:  Good  Insight:   Good  Judgment:  Good  Impulse Control: Good   Risk Assessment: Danger to Self:  No Self-injurious Behavior: No Danger to Others: No Duty to Warn:no Physical Aggression / Violence:No  Access to Firearms a concern: Yes ; pt agreed last session to give the keys to the safe to [name], but he has not done it yet.  He agrees to do it between now and next session. Gang Involvement:No   Subjective: Pt shares that "I am doing fine.  I am still sober (126 days; 18 weeks).  Work is still busy but it is OK.  It seems I am on a positive crest at work because I have been able to work through some problems for other people."  Pt shares that he enjoys fixing problems.  Pt shares he has been running and has been enjoying that.  His knee is giving him some issues and he will take today off.  He has been running 3 miles each time and knows that it is good for his mental health.  Pt shares that his family is good; [name] is planning to be the manager of the track team at school (7th grade); "I am not sure why she is doing  that but that is fine.  [name] is in 5th grade so they will have two middle schoolers next year.  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  Pt is pleased with how appreciative [name] is for what pt does for the family.  Pt is being very intentional about being and staying sober.  He continues to sleep well for the most part and appreciates his sleep.  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  Pt shares that he is struggling with the current political climate but is trying to limit his exposure to the news.  He believes the [place] is the best country to live in and changes in leadership in most countries are messy.  "I try not to worry about it too much."  Encouraged pt to continue to think about his self care activities and to engage with them as regularly as he can and we will  meet in 5 wks for a follow up session, due to my vacation.   Interventions: Cognitive Behavioral Therapy  Diagnosis: Alcohol use disorder, moderate, in controlled environment, dependence (HCC)  Major depressive disorder, recurrent episode, moderate (HCC)  Plan: Treatment Plan Strengths/Abilities:  Intelligent, Intuitive, Willing to participate in therapy Treatment Preferences:  Outpatient Individual Therapy Statement of Needs:  Patient is to use CBT, mindfulness and coping skills to help manage and/or decrease symptoms associated with their diagnosis. Symptoms:  Depressed/Irritable mood, worry, social withdrawal Problems Addressed:  Depressive thoughts, Sadness, Sleep issues, etc. Long Term Goals:  Pt to reduce overall level, frequency, and intensity of the  feelings of depression as evidenced by decreased irritability, negative self talk, and helpless feelings from 6 to 7 days/week to 0 to 1 days/week, per client report, for at least 3 consecutive months.  Progress: 30% Short Term Goals:  Pt to verbally express understanding of the relationship between feelings of depression and their impact on thinking patterns and behaviors.  Pt to verbalize an understanding of the role that distorted thinking plays in creating fears, excessive worry, and ruminations.  Progress: 30% Target Date:  08/14/2024 Frequency:  Bi-weekly Modality:  Cognitive Behavioral Therapy Interventions by Therapist:  Therapist will use CBT, Mindfulness exercises, Coping skills and Referrals, as needed by client. Client has verbally approved this treatment plan.  Karie Kirks, [facility]               Karie Kirks, [facility]

## 2024-01-29 ENCOUNTER — Ambulatory Visit (INDEPENDENT_AMBULATORY_CARE_PROVIDER_SITE_OTHER): Admitting: Psychology

## 2024-01-29 DIAGNOSIS — F331 Major depressive disorder, recurrent, moderate: Secondary | ICD-10-CM

## 2024-01-29 DIAGNOSIS — F1020 Alcohol dependence, uncomplicated: Secondary | ICD-10-CM

## 2024-01-29 NOTE — Progress Notes (Signed)
 [facility] Counselor/Therapist Progress Note  Patient ID: Matthew Mckinney, MRN: [id number],    Date: 01/29/2024  Time Spent: 50 mins  start time: 0900  end time: 0950  Treatment Type: Individual Therapy  Reported Symptoms: Pt presents via Caregility video for this session.  Pt grants consent for the session, stating he is in his home with no one else present; pt acknowledges the limits of virtual sessions.  I shared with pt that I am in my office with no one else here either.  Mental Status Exam: Appearance:  Casual     Behavior: Appropriate  Motor: Normal  Speech/Language:  Clear and Coherent  Affect: Appropriate  Mood: depressed  Thought process: normal  Thought content:   WNL  Sensory/Perceptual disturbances:   WNL  Orientation: oriented to person, place, and time/date  Attention: Good  Concentration: Good  Memory: WNL  Fund of knowledge:  Good  Insight:   Good  Judgment:  Good  Impulse Control: Good   Risk Assessment: Danger to Self:  No Self-injurious Behavior: No Danger to Others: No Duty to Warn:no Physical Aggression / Violence:No  Access to Firearms a concern: Yes ; pt agreed last session to give the keys to the safe to Matthew Mckinney, but he has not done it yet.  He agrees to do it between now and next session. Gang Involvement:No   Subjective: Pt shares that "I am doing fine.  I am still sober (163 days so far).  Work is still busy but it is OK.  Matthew Mckinney's parents have been visiting with us  for the past week; they went to [facility] to visit other friends yesterday and will be gone for a few days.  I have no idea how long they will stay once they get back."  Pt shares that he continues to sleep well being sober and he appreciates good sleep.  Pt shares that the kids are good; Matthew Mckinney is talking more about her procedure coming up this summer.  She does not appear to be afraid but is just curious.  She does complain about pain in her back from time to time and he  is hopeful that the procedure will help with that.  Both kids are doing well in school.  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  Pt shares that he has not been running lately, "because of laziness."  Pt shares that he signed up for guitar lessons and asked the teacher for accountability and challenging him.  He will be taking lessons on Wednesday evenings.  Pt has uploaded his resume to [employer] and he is now intent on finding a new job this summer after BJ's procedure.  He knows he should take time to choose a good fit for him when choosing his next role.  Matthew Mckinney is having more of an issue with her mom being here than everyone else and she is getting more impact of it than anyone else.  Pt shares that he is continuing to struggle with the current political climate but is trying to limit his exposure to the news.  Encouraged pt to continue to think about his self care activities and to engage with them as regularly as he can and we will meet in 4 wks for a follow up session, due to pt's work schedule.   Interventions: Cognitive Behavioral Therapy  Diagnosis: Alcohol use disorder, moderate, in controlled environment, dependence (HCC)  Major depressive disorder, recurrent episode,  moderate (HCC)  Plan: Treatment Plan Strengths/Abilities:  Intelligent, Intuitive, Willing to participate in therapy Treatment Preferences:  Outpatient Individual Therapy Statement of Needs:  Patient is to use CBT, mindfulness and coping skills to help manage and/or decrease symptoms associated with their diagnosis. Symptoms:  Depressed/Irritable mood, worry, social withdrawal Problems Addressed:  Depressive thoughts, Sadness, Sleep issues, etc. Long Term Goals:  Pt to reduce overall level, frequency, and intensity of the feelings of depression as evidenced by decreased irritability, negative self talk, and helpless feelings from 6 to 7 days/week to 0 to 1  days/week, per client report, for at least 3 consecutive months.  Progress: 30% Short Term Goals:  Pt to verbally express understanding of the relationship between feelings of depression and their impact on thinking patterns and behaviors.  Pt to verbalize an understanding of the role that distorted thinking plays in creating fears, excessive worry, and ruminations.  Progress: 30% Target Date:  08/14/2024 Frequency:  Bi-weekly Modality:  Cognitive Behavioral Therapy Interventions by Therapist:  Therapist will use CBT, Mindfulness exercises, Coping skills and Referrals, as needed by client. Client has verbally approved this treatment plan.  Matthew Mckinney, [facility]

## 2024-02-26 ENCOUNTER — Ambulatory Visit (INDEPENDENT_AMBULATORY_CARE_PROVIDER_SITE_OTHER): Admitting: Psychology

## 2024-02-26 DIAGNOSIS — F1020 Alcohol dependence, uncomplicated: Secondary | ICD-10-CM

## 2024-02-26 DIAGNOSIS — F331 Major depressive disorder, recurrent, moderate: Secondary | ICD-10-CM

## 2024-02-26 NOTE — Progress Notes (Signed)
 [facility] Counselor/Therapist Progress Note  Patient ID: Matthew Mckinney, MRN: [id number],    Date: 02/26/2024  Time Spent: 60 mins  start time: 0900  end time: 1000  Treatment Type: Individual Therapy  Reported Symptoms: Pt presents via Caregility video for this session.  Pt grants consent for the session, stating he is in his home with no one else present; pt acknowledges the limits of virtual sessions.  I shared with pt that I am in my office with no one else here either.  Mental Status Exam: Appearance:  Casual     Behavior: Appropriate  Motor: Normal  Speech/Language:  Clear and Coherent  Affect: Appropriate  Mood: depressed  Thought process: normal  Thought content:   WNL  Sensory/Perceptual disturbances:   WNL  Orientation: oriented to person, place, and time/date  Attention: Good  Concentration: Good  Memory: WNL  Fund of knowledge:  Good  Insight:   Good  Judgment:  Good  Impulse Control: Good   Risk Assessment: Danger to Self:  No Self-injurious Behavior: No Danger to Others: No Duty to Warn:no Physical Aggression / Violence:No  Access to Firearms a concern: Yes ; pt agreed last session to give the keys to the safe to Matthew Mckinney, but he has not done it yet.  He agrees to do it between now and next session. Gang Involvement:No   Subjective: Pt shares that "I am doing fine.  I did take a vacation with friends last week and, at our last session, I knew I was going but I did not tell you and I felt bad about that.  I talked to Matthew Mckinney about this last night and told her that I was going to have to tell you today; while I was on vacation, I did drink but I have been sober since this past Sunday."  Talked with pt about that this his time and what he tells me is his business.  He had been sober for 178 days and has now been sober for the past 4 days.  Pt shares he was with a group of 10 friends and they met mostly in college.  They went to [facility] and  rented a home for the week.  Pt shares he enjoyed his break from work and that was good.  Pt shares that Matthew Mckinney's parents went back home since our last visit; he and Matthew Mckinney are both pleased with that they have their home back.  Pt shares the girls are doing fine and getting ready for school to end in another couple of weeks.  Matthew Mckinney's surgery is set for 7/15; she has to have an MRI soon, in advance of the surgery.  Pt shares that his father had hernia surgery a few weeks ago and has now recovered.  Pt has additional questions about Matthew Mckinney's surgery and I encouraged pt to contact the office to ask his questions and get all of the answers that he wants/needs.  Pt shares that they are going to have a party for Matthew Mckinney before her procedure this summer.  Matthew Mckinney will be going to some volleyball camps this summer.  Pt shares "that work still sucks because we don't have the leadership that we need.  He has posted his resume to various websites and is getting info about jobs.  He has also looked into the state retirement plan and knows that he will max out his retirement benefits in 9 more years.  He may choose to stay but is still trying to  decide.  Pt shares that he is continuing to struggle with the current political climate but is trying to limit his exposure to the news.  Encouraged pt to continue to think about his self care activities and to engage with them as regularly as he can and we will meet in 2 wks for a follow up session.   Interventions: Cognitive Behavioral Therapy  Diagnosis: Alcohol use disorder, moderate, in controlled environment, dependence (HCC)  Major depressive disorder, recurrent episode, moderate (HCC)  Plan: Treatment Plan Strengths/Abilities:  Intelligent, Intuitive, Willing to participate in therapy Treatment Preferences:  Outpatient Individual Therapy Statement of Needs:  Patient is to use CBT, mindfulness and coping skills to help manage and/or decrease symptoms associated with  their diagnosis. Symptoms:  Depressed/Irritable mood, worry, social withdrawal Problems Addressed:  Depressive thoughts, Sadness, Sleep issues, etc. Long Term Goals:  Pt to reduce overall level, frequency, and intensity of the feelings of depression as evidenced by decreased irritability, negative self talk, and helpless feelings from 6 to 7 days/week to 0 to 1 days/week, per client report, for at least 3 consecutive months.  Progress: 30% Short Term Goals:  Pt to verbally express understanding of the relationship between feelings of depression and their impact on thinking patterns and behaviors.  Pt to verbalize an understanding of the role that distorted thinking plays in creating fears, excessive worry, and ruminations.  Progress: 30% Target Date:  08/14/2024 Frequency:  Bi-weekly Modality:  Cognitive Behavioral Therapy Interventions by Therapist:  Therapist will use CBT, Mindfulness exercises, Coping skills and Referrals, as needed by client. Client has verbally approved this treatment plan.  [name], [facility]

## 2024-03-12 ENCOUNTER — Ambulatory Visit: Admitting: Psychology

## 2024-03-12 DIAGNOSIS — F331 Major depressive disorder, recurrent, moderate: Secondary | ICD-10-CM | POA: Diagnosis not present

## 2024-03-12 DIAGNOSIS — F1020 Alcohol dependence, uncomplicated: Secondary | ICD-10-CM

## 2024-03-12 NOTE — Progress Notes (Signed)
 [facility] Counselor/Therapist Progress Note  Patient ID: Matthew Mckinney, MRN: [id number],    Date: 03/12/2024  Time Spent: 60 mins  start time: 0900  end time: 1000  Treatment Type: Individual Therapy  Reported Symptoms: Pt presents via Caregility video for this session.  Pt grants consent for the session, stating he is in his home with no one else present; pt acknowledges the limits of virtual sessions.  I shared with pt that I am in my office with no one else here either.  Mental Status Exam: Appearance:  Casual     Behavior: Appropriate  Motor: Normal  Speech/Language:  Clear and Coherent  Affect: Appropriate  Mood: depressed  Thought process: normal  Thought content:   WNL  Sensory/Perceptual disturbances:   WNL  Orientation: oriented to person, place, and time/date  Attention: Good  Concentration: Good  Memory: WNL  Fund of knowledge:  Good  Insight:   Good  Judgment:  Good  Impulse Control: Good   Risk Assessment: Danger to Self:  No Self-injurious Behavior: No Danger to Others: No Duty to Warn:no Physical Aggression / Violence:No  Access to Firearms a concern: Yes ; pt agreed last session to give the keys to the safe to [name], but he has not done it yet.  He agrees to do it between now and next session. Gang Involvement:No   Subjective: Pt shares that "I am doing OK.  Work is stressful right now with the same old sorts of stuff and that makes it even more frustrating."  Pt shared that he called out sick yesterday just to get a break.  "I cleaned out the garage some and then went for a really long walk (about 7 miles) and that was good for me."  Pt shares that during his walk, he had multiple internal dialogs with himself and he came to the realization that he can only control what he can control and he was able to focus on that.  He also took opportunities to use his senses to experience parts of his walk more concretely.  Talked with pt about  scheduling MH days off for himself over the next few months.  Pt has been thinking more about looking for other jobs right now.  Pt shares that he has not drank since his vacation (18 days); "it has been a breeze to not drink."  Pt shares that the girls are almost finished with school; [name] has her 5th grade graduation next week.  Her pre-op appt is 6/23 and her surgery is set for 7/15.  They are having her pool party in a couple of weeks and she is looking forward to that.  [name] is finished with everything at 7th grade this year.  Pt shares that they do not have a vacation planned for his summer because of [name]'s surgery; they will likely go visit family in [place].  Pt shares that he has not been sleeping great lately; he is not sure why this has been happening; asked pt to keep tracking this between now and our next session.   [name] will be going to some volleyball camps this summer.  Pt shares that he is continuing to struggle with the current political climate but is trying to limit his exposure to the news.  Encouraged pt to continue to think about his self care activities and to engage with them as regularly as he can and we will meet in 2 wks for a follow up session.   Interventions:  Cognitive Behavioral Therapy  Diagnosis: Alcohol use disorder, moderate, in controlled environment, dependence (HCC)  Major depressive disorder, recurrent episode, moderate (HCC)  Plan: Treatment Plan Strengths/Abilities:  Intelligent, Intuitive, Willing to participate in therapy Treatment Preferences:  Outpatient Individual Therapy Statement of Needs:  Patient is to use CBT, mindfulness and coping skills to help manage and/or decrease symptoms associated with their diagnosis. Symptoms:  Depressed/Irritable mood, worry, social withdrawal Problems Addressed:  Depressive thoughts, Sadness, Sleep issues, etc. Long Term Goals:  Pt to reduce overall level, frequency, and intensity of the feelings of depression as  evidenced by decreased irritability, negative self talk, and helpless feelings from 6 to 7 days/week to 0 to 1 days/week, per client report, for at least 3 consecutive months.  Progress: 30% Short Term Goals:  Pt to verbally express understanding of the relationship between feelings of depression and their impact on thinking patterns and behaviors.  Pt to verbalize an understanding of the role that distorted thinking plays in creating fears, excessive worry, and ruminations.  Progress: 30% Target Date:  08/14/2024 Frequency:  Bi-weekly Modality:  Cognitive Behavioral Therapy Interventions by Therapist:  Therapist will use CBT, Mindfulness exercises, Coping skills and Referrals, as needed by client. Client has verbally approved this treatment plan.  [name], [facility]

## 2024-03-26 ENCOUNTER — Ambulatory Visit (INDEPENDENT_AMBULATORY_CARE_PROVIDER_SITE_OTHER): Admitting: Psychology

## 2024-03-26 DIAGNOSIS — F1020 Alcohol dependence, uncomplicated: Secondary | ICD-10-CM | POA: Diagnosis not present

## 2024-03-26 DIAGNOSIS — F331 Major depressive disorder, recurrent, moderate: Secondary | ICD-10-CM

## 2024-03-26 NOTE — Progress Notes (Signed)
 Matthew Mckinney Progress Note  Patient ID: Matthew Mckinney, MRN: [id number],    Date: 03/26/2024  Time Spent: 60 mins  start time: 0900  end time: 1000  Treatment Type: Individual Therapy  Reported Symptoms: Pt presents via Caregility video for this session.  Pt grants consent for the session, stating he is in his home with no one else present; pt acknowledges the limits of virtual sessions.  I shared with pt that I am in my office with no one else here either.  Mental Status Exam: Appearance:  Casual     Behavior: Appropriate  Motor: Normal  Speech/Language:  Clear and Coherent  Affect: Appropriate  Mood: depressed  Thought process: normal  Thought content:   WNL  Sensory/Perceptual disturbances:   WNL  Orientation: oriented to person, place, and time/date  Attention: Good  Concentration: Good  Memory: WNL  Fund of knowledge:  Good  Insight:   Good  Judgment:  Good  Impulse Control: Good   Risk Assessment: Danger to Self:  No Self-injurious Behavior: No Danger to Others: No Duty to Warn:no Physical Aggression / Violence:No  Access to Firearms a concern: Yes ; pt agreed last session to give the keys to the safe to Matthew Mckinney, but he has not done it yet.  He agrees to do it between now and next session. Gang Involvement:No   Subjective: Pt shares that I am doing OK.  Work continues to be stressful right now with the same old sorts of stuff and that makes it even more frustrating.  Pt is off work today for [name] and he is glad to have that vacation day.  He is working today to get ready for the Adult nurse to come tomorrow and they are going to paint Matthew Mckinney's room as her gift for 5th grade graduation.  Pt shares that he has been running some when the weather will allow, playing guitar, and taking guitar lessons, playing video games, they went out to dinner with friends as well.  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  The teacher is very good at his craft, per pt.  Pt's lessons are each Monday evening; teacher is in [name] with the [employer] orchestra for the next two weeks.  Pt shares that Matthew Mckinney and he are working hard early in the summer with the girls being out of school.  Pt and Matthew Mckinney had a pool party for [employer] and all of the girls had fun.  Her pre-op appt is 6/23 and her surgery is set for 7/15.  He and Matthew Mckinney are concerned for her but they trust the surgeon and the office.  Pt shares that he believes he is now ready to leave his job, after [name] recovers.  He will start looking for a new job later in the summer.  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  Pt has still been sober since our last session (32 days).  He had been sober for 178 days, drank for 7 days, and now has been sober for 32 additional days.  Pt considered drinking yesterday and last night but he chose not to.  He was the designated driver; Matthew Mckinney had a couple of drinks and his friends drank and pt still had a nice time.  Pt shares  that they do not have a vacation planned for his summer because of Matthew Mckinney's surgery; they will likely go visit family in [place].  Pt's sleep has gotten better and he is appreciative for that.  Pt shares that he is continuing to struggle with the current political climate but is trying to limit his exposure to the news.  Encouraged pt to continue to think about his self care activities and to engage with them as regularly as he can and we will meet in 2 wks for a follow up session.   Interventions: Cognitive Behavioral Therapy  Diagnosis: Alcohol use disorder, moderate, in controlled environment, dependence (HCC)  Major depressive disorder, recurrent episode, moderate (HCC)  Plan: Treatment Plan Strengths/Abilities:   Intelligent, Intuitive, Willing to participate in therapy Treatment Preferences:  Outpatient Individual Therapy Statement of Needs:  Patient is to use CBT, mindfulness and coping skills to help manage and/or decrease symptoms associated with their diagnosis. Symptoms:  Depressed/Irritable mood, worry, social withdrawal Problems Addressed:  Depressive thoughts, Sadness, Sleep issues, etc. Long Term Goals:  Pt to reduce overall level, frequency, and intensity of the feelings of depression as evidenced by decreased irritability, negative self talk, and helpless feelings from 6 to 7 days/week to 0 to 1 days/week, per client report, for at least 3 consecutive months.  Progress: 30% Short Term Goals:  Pt to verbally express understanding of the relationship between feelings of depression and their impact on thinking patterns and behaviors.  Pt to verbalize an understanding of the role that distorted thinking plays in creating fears, excessive worry, and ruminations.  Progress: 30% Target Date:  08/14/2024 Frequency:  Bi-weekly Modality:  Cognitive Behavioral Therapy Interventions by Therapist:  Therapist will use CBT, Mindfulness exercises, Coping skills and Referrals, as needed by client. Client has verbally approved this treatment plan.  Matthew Mckinney, [facility]

## 2024-04-15 ENCOUNTER — Ambulatory Visit (INDEPENDENT_AMBULATORY_CARE_PROVIDER_SITE_OTHER): Admitting: Psychology

## 2024-04-15 DIAGNOSIS — F1020 Alcohol dependence, uncomplicated: Secondary | ICD-10-CM

## 2024-04-15 DIAGNOSIS — F331 Major depressive disorder, recurrent, moderate: Secondary | ICD-10-CM | POA: Diagnosis not present

## 2024-04-15 NOTE — Progress Notes (Signed)
 [facility] Counselor/Therapist Progress Note  Patient ID: Matthew Mckinney, MRN: [id number],    Date: 04/15/2024  Time Spent: 60 mins  start time: 1000  end time: 1100  Treatment Type: Individual Therapy  Reported Symptoms: Pt presents via Caregility video for this session.  Pt grants consent for the session, stating he is in his home with no one else present; pt acknowledges the limits of virtual sessions.  I shared with pt that I am in my office with no one else here either.  Mental Status Exam: Appearance:  Casual     Behavior: Appropriate  Motor: Normal  Speech/Language:  Clear and Coherent  Affect: Appropriate  Mood: depressed  Thought process: normal  Thought content:   WNL  Sensory/Perceptual disturbances:   WNL  Orientation: oriented to person, place, and time/date  Attention: Good  Concentration: Good  Memory: WNL  Fund of knowledge:  Good  Insight:   Good  Judgment:  Good  Impulse Control: Good   Risk Assessment: Danger to Self:  No Self-injurious Behavior: No Danger to Others: No Duty to Warn:no Physical Aggression / Violence:No  Access to Firearms a concern: Yes ; pt agreed last session to give the keys to the safe to [name], but he has not done it yet.  He agrees to do it between now and next session. Gang Involvement:No   Subjective: Pt shares that I am doing OK.  [name] has been doing good things with the girls this summer, before [name]'s surgery on Tuesday.  I am really anxious about it.  I have tried to talk with [name] about it and she is looking at it differently; she is looking at the recovery process for [name], looking past the surgery itself.  Pt shares that he has learned that his sister has a tumor on her thyroid; he is not sure what the treatment options will be for his sister.  They have met with the orthopedic surgeon and the plastic surgeon and took a tour of the facility where they will do the surgery.  That was helpful for  pt and the family.  Pt gets emotional when he is describing what the treatment team told them.  Talked pt through the emotions of the situation for him.  Pt shares that they did get [name]'s room painted and got the carpet cleaned.  Pt has been playing guitar, and taking guitar lessons, playing video games, they went out to dinner with friends as well.  His guitar teacher came back from his trip to [place] and shared photos with pt of his trip and told stories of his trip.  Pt is taking all of next week off as well as the Monday and Tuesday after that to make sure he can be available for everything that might need to be done.  Pt has still been sober since our last session (46 days-5/18).  Pt shares that he is continuing to struggle with the current political climate but is trying to limit his exposure to the news.  Encouraged pt to continue to think about his self care activities and to engage with them as regularly as he can and we will meet in 2 wks for a follow up session.   Interventions: Cognitive Behavioral Therapy  Diagnosis: Alcohol use disorder, moderate, in controlled environment, dependence (HCC)  Major depressive disorder, recurrent episode, moderate (HCC)  Plan: Treatment Plan Strengths/Abilities:  Intelligent, Intuitive, Willing to participate in therapy Treatment Preferences:  Outpatient Individual Therapy Statement of Needs:  Patient is to use CBT, mindfulness and coping skills to help manage and/or decrease symptoms associated with their diagnosis. Symptoms:  Depressed/Irritable mood, worry, social withdrawal Problems Addressed:  Depressive thoughts, Sadness, Sleep issues, etc. Long Term Goals:  Pt to reduce overall level, frequency, and intensity of the feelings of depression as evidenced by decreased irritability, negative self talk, and helpless feelings from 6 to 7 days/week to 0 to 1 days/week, per client report, for at least 3 consecutive months.  Progress: 30% Short Term  Goals:  Pt to verbally express understanding of the relationship between feelings of depression and their impact on thinking patterns and behaviors.  Pt to verbalize an understanding of the role that distorted thinking plays in creating fears, excessive worry, and ruminations.  Progress: 30% Target Date:  08/14/2024 Frequency:  Bi-weekly Modality:  Cognitive Behavioral Therapy Interventions by Therapist:  Therapist will use CBT, Mindfulness exercises, Coping skills and Referrals, as needed by client. Client has verbally approved this treatment plan.  Francis KATHEE Macintosh, [facility]

## 2024-04-30 ENCOUNTER — Ambulatory Visit: Admitting: Psychology

## 2024-04-30 DIAGNOSIS — F331 Major depressive disorder, recurrent, moderate: Secondary | ICD-10-CM

## 2024-04-30 DIAGNOSIS — F1020 Alcohol dependence, uncomplicated: Secondary | ICD-10-CM

## 2024-04-30 NOTE — Progress Notes (Signed)
  [facility] Counselor/Therapist Progress Note  Patient ID: Matthew Mckinney, MRN: [id number],    Date: 04/30/2024  Time Spent: 55 mins  start time: 1100  end time: 1155  Treatment Type: Individual Therapy  Reported Symptoms: Pt presents via Caregility video for this session.  Pt grants consent for the session, stating he is in his home with no one else present; pt acknowledges the limits of virtual sessions.  I shared with pt that I am in my office with no one else here either.  Mental Status Exam: Appearance:  Casual     Behavior: Appropriate  Motor: Normal  Speech/Language:  Clear and Coherent  Affect: Appropriate  Mood: depressed  Thought process: normal  Thought content:   WNL  Sensory/Perceptual disturbances:   WNL  Orientation: oriented to person, place, and time/date  Attention: Good  Concentration: Good  Memory: WNL  Fund of knowledge:  Good  Insight:   Good  Judgment:  Good  Impulse Control: Good   Risk Assessment: Danger to Self:  No Self-injurious Behavior: No Danger to Others: No Duty to Warn:no Physical Aggression / Violence:No  Access to Firearms a concern: Yes ; pt agreed last session to give the keys to the safe to [name], but he has not done it yet.  He agrees to do it between now and next session. Gang Involvement:No   Subjective: Pt shares that Everything went well with [name]'s surgery and her recovery is going well.  It was difficult for the first week and then she started getting better.  She has steadily improving each day.  She is now getting up by herself and is walking around on her own.  The doctor told us  that everything went well.  Pt shares that the process to this point has been as bad as he thought it would be and it was better than it thought it would be.  Pt shares that [name]'s appetite is returning.  Pt took off all of the week of the surgery off and went back to work yesterday and it is going fine.  Pt shares that  her sister and her family came brought them dinner last night and he appreciated that; his parents have also been supportive of them.  [name]'s parents (mom and step dad) came in for the surgery and left yesterday.  [name]'s dad lives in [place] now with his new wife and is a super Theme park manager.  Pt has continued to take guitar lessons but has not been playing much guitar because of the time constraints from last week.  He has signed up for [employer] and is learning [place].  Pt has still been sober since our last session (60 days-5/18).  Pt shares that he is continuing to struggle with the current political climate but is trying to limit his exposure to the news.  Pt's parents are planning a beach trip and pt does not believe they should consider going because of [name]'s recovery.  Encouraged pt to continue to think about his self care activities and to engage with them as regularly as he can and we will meet in 2 wks for a follow up session.   Interventions: Cognitive Behavioral Therapy  Diagnosis: Alcohol use disorder, moderate, in controlled environment, dependence (HCC)  Major depressive disorder, recurrent episode, moderate (HCC)  Plan: Treatment Plan Strengths/Abilities:  Intelligent, Intuitive, Willing to participate in therapy Treatment Preferences:  Outpatient Individual Therapy Statement of Needs:  Patient is to use CBT, mindfulness and coping skills  to help manage and/or decrease symptoms associated with their diagnosis. Symptoms:  Depressed/Irritable mood, worry, social withdrawal Problems Addressed:  Depressive thoughts, Sadness, Sleep issues, etc. Long Term Goals:  Pt to reduce overall level, frequency, and intensity of the feelings of depression as evidenced by decreased irritability, negative self talk, and helpless feelings from 6 to 7 days/week to 0 to 1 days/week, per client report, for at least 3 consecutive months.  Progress: 30% Short Term Goals:  Pt to verbally express  understanding of the relationship between feelings of depression and their impact on thinking patterns and behaviors.  Pt to verbalize an understanding of the role that distorted thinking plays in creating fears, excessive worry, and ruminations.  Progress: 30% Target Date:  08/14/2024 Frequency:  Bi-weekly Modality:  Cognitive Behavioral Therapy Interventions by Therapist:  Therapist will use CBT, Mindfulness exercises, Coping skills and Referrals, as needed by client. Client has verbally approved this treatment plan.  Francis KATHEE Macintosh, [facility]

## 2024-05-14 ENCOUNTER — Ambulatory Visit: Admitting: Psychology

## 2024-05-14 DIAGNOSIS — F331 Major depressive disorder, recurrent, moderate: Secondary | ICD-10-CM | POA: Diagnosis not present

## 2024-05-14 DIAGNOSIS — F1020 Alcohol dependence, uncomplicated: Secondary | ICD-10-CM

## 2024-05-14 NOTE — Progress Notes (Signed)
 [facility] Counselor/Therapist Progress Note  Patient ID: Matthew Mckinney, MRN: [id number],    Date: 05/14/2024  Time Spent: 55 mins  start time: 0900  end time: 0955  Treatment Type: Individual Therapy  Reported Symptoms: Pt presents via Caregility video for this session.  Pt grants consent for the session, stating he is in his home with no one else present; pt acknowledges the limits of virtual sessions.  I shared with pt that I am in my office with no one else here either.  Mental Status Exam: Appearance:  Casual     Behavior: Appropriate  Motor: Normal  Speech/Language:  Clear and Coherent  Affect: Appropriate  Mood: depressed  Thought process: normal  Thought content:   WNL  Sensory/Perceptual disturbances:   WNL  Orientation: oriented to person, place, and time/date  Attention: Good  Concentration: Good  Memory: WNL  Fund of knowledge:  Good  Insight:   Good  Judgment:  Good  Impulse Control: Good   Risk Assessment: Danger to Self:  No Self-injurious Behavior: No Danger to Others: No Duty to Warn:no Physical Aggression / Violence:No  Access to Firearms a concern: Yes ; pt agreed last session to give the keys to the safe to Matthew Mckinney, but he has not done it yet.  He agrees to do it between now and next session. Gang Involvement:No   Subjective: Pt shares that Matthew Mckinney's recovery continues to improve and she has gotten to the point of being surly about doing what she needs to do.  Pt shares that they have been back to the surgeon's office twice to have her bandage removed.  The staff at the office were able to get part of the bandage off and they can see a portion of the incision and it is healing well.  They have been discharged from the plastic surgeon's care (he was responsible for the incision and any infection.  They will go back to see the neurosurgeon on 8/18 for their first follow up with him.  Pt shares that Matthew Mckinney and Matthew Mckinney and pt all understand  they do not know what Matthew Mckinney is feeling but they believe she is acting some in this situation.  Pt shares that she yelled and screamed so much at the doctor's office staff that they told them not to come back.  Matthew Mckinney is managing 99% percent of the care for [name].  Pt shares that his parents are taking Matthew Mckinney and the girls to the beach next week; pt is not going and he is not sure if it is a good idea for them but he also thinks a change of scenery for her might be good.  Pt shares that work is going fine but is kind of annoying.  I am passively looking for a new job.  Pt shares that he does not have a 4 yr degree and that is keeping from being able to apply for certain jobs.  Pt shares that he has been feeling more depressed and has been having thoughts of suicide; he sees the misery of mundane life and wonders why he continues to participate in it.  Pt was staving off those thoughts until after Matthew Mckinney's procedure and now he has been thinking about it again.  Pt shares that he does not find any joy in his life at this time.  Pt does say that he has no plans or intention of acting on the thoughts of suicide.  Pt shares he continues to maintain his sobriety (81  days-5/18) but it has been hard to do so.  Pt has been thinking about drinking but has not done it yet.  Talked with pt about the option of taking anti-depressant medication.  Pt shares that he has taken them before but never got to the point of increasing his dose above the introductory dose.  Pt is willing to take info about [name] and plans to call them for an appt.  Encouraged pt to continue to think about his self care activities and to engage with them as regularly as he can and we will meet in 2 wks for a follow up session.   Interventions: Cognitive Behavioral Therapy  Diagnosis: Alcohol use disorder, moderate, in controlled environment, dependence (HCC)  Major depressive disorder, recurrent episode, moderate (HCC)  Plan:  Treatment Plan Strengths/Abilities:  Intelligent, Intuitive, Willing to participate in therapy Treatment Preferences:  Outpatient Individual Therapy Statement of Needs:  Patient is to use CBT, mindfulness and coping skills to help manage and/or decrease symptoms associated with their diagnosis. Symptoms:  Depressed/Irritable mood, worry, social withdrawal Problems Addressed:  Depressive thoughts, Sadness, Sleep issues, etc. Long Term Goals:  Pt to reduce overall level, frequency, and intensity of the feelings of depression as evidenced by decreased irritability, negative self talk, and helpless feelings from 6 to 7 days/week to 0 to 1 days/week, per client report, for at least 3 consecutive months.  Progress: 30% Short Term Goals:  Pt to verbally express understanding of the relationship between feelings of depression and their impact on thinking patterns and behaviors.  Pt to verbalize an understanding of the role that distorted thinking plays in creating fears, excessive worry, and ruminations.  Progress: 30% Target Date:  08/14/2024 Frequency:  Bi-weekly Modality:  Cognitive Behavioral Therapy Interventions by Therapist:  Therapist will use CBT, Mindfulness exercises, Coping skills and Referrals, as needed by client. Client has verbally approved this treatment plan.  Matthew Mckinney KATHEE Macintosh, [facility]

## 2024-05-28 ENCOUNTER — Ambulatory Visit (INDEPENDENT_AMBULATORY_CARE_PROVIDER_SITE_OTHER): Admitting: Psychology

## 2024-05-28 DIAGNOSIS — F331 Major depressive disorder, recurrent, moderate: Secondary | ICD-10-CM

## 2024-05-28 DIAGNOSIS — F1020 Alcohol dependence, uncomplicated: Secondary | ICD-10-CM

## 2024-05-28 NOTE — Progress Notes (Signed)
 [facility] Counselor/Therapist Progress Note  Patient ID: Matthew Mckinney, MRN: [id number],    Date: 05/28/2024  Time Spent: 50 mins  start time: 0900  end time: 0950  Treatment Type: Individual Therapy  Reported Symptoms: Pt presents via Caregility video for this session.  Pt grants consent for the session, stating he is in his home with no one else present; pt acknowledges the limits of virtual sessions.  I shared with pt that I am in my office with no one else here either.  Mental Status Exam: Appearance:  Casual     Behavior: Appropriate  Motor: Normal  Speech/Language:  Clear and Coherent  Affect: Appropriate  Mood: depressed  Thought process: normal  Thought content:   WNL  Sensory/Perceptual disturbances:   WNL  Orientation: oriented to person, place, and time/date  Attention: Good  Concentration: Good  Memory: WNL  Fund of knowledge:  Good  Insight:   Good  Judgment:  Good  Impulse Control: Good   Risk Assessment: Danger to Self:  No Self-injurious Behavior: No Danger to Others: No Duty to Warn:no Physical Aggression / Violence:No  Access to Firearms a concern: Yes ; pt agreed last session to give the keys to the safe to [name], but he has not done it yet.  He agrees to do it between now and next session. Gang Involvement:No   Subjective: Pt shares that [name]'s recovery is going OK but her confidence is low.  We met with the surgeon on Monday and got a great report; she is limited to walking in PE for the next 6 wks.  She told [name] that she feels like a freak because her posture.  The surgeon reassured her and gave them pointers about how to progress with her recovery.  Pt shares that he was encouraged by the surgeon's appt.  It was a scary thing and it created anxiety for me but it is getting better.  Pt shares that work sucks pretty bad and I applied for a job online and submitted it.  He completed it last night.  He is looking for a  change at this point in his career.  Pt shares that he has continued to have some depressive thoughts but denies current SI, plan or intent.  [name] and the girls went to the beach since our last session and pt had planned to drink but he ended up not doing it; he is still sober (since 5/18).  He is going out of town this weekend and he is ambivalent about drinking; the whole family is going and he will likely not drink.  Pt shares that his parents are doing well.  Pt shares that he has an appt with his PCP next week and he will ask PCP about getting on an antidepressant again; he is hopeful that medication will be beneficial for him.  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        Encouraged pt to continue to think about his self care activities and to engage with them as regularly as he can and we will meet in 2 wks for a follow up session.   Interventions: Cognitive Behavioral Therapy  Diagnosis: Alcohol use disorder, moderate, in controlled environment, dependence (HCC)  Major depressive disorder, recurrent episode, moderate (HCC)  Plan: Treatment Plan Strengths/Abilities:  Intelligent, Intuitive, Willing to participate in therapy Treatment Preferences:  Outpatient Individual  Therapy Statement of Needs:  Patient is to use CBT, mindfulness and coping skills to help manage and/or decrease symptoms associated with their diagnosis. Symptoms:  Depressed/Irritable mood, worry, social withdrawal Problems Addressed:  Depressive thoughts, Sadness, Sleep issues, etc. Long Term Goals:  Pt to reduce overall level, frequency, and intensity of the feelings of depression as evidenced by decreased irritability, negative self talk, and helpless feelings from 6 to 7 days/week to 0 to 1 days/week, per client report, for at least 3 consecutive months.  Progress: 30% Short Term Goals:  Pt to verbally express  understanding of the relationship between feelings of depression and their impact on thinking patterns and behaviors.  Pt to verbalize an understanding of the role that distorted thinking plays in creating fears, excessive worry, and ruminations.  Progress: 30% Target Date:  08/14/2024 Frequency:  Bi-weekly Modality:  Cognitive Behavioral Therapy Interventions by Therapist:  Therapist will use CBT, Mindfulness exercises, Coping skills and Referrals, as needed by client. Client has verbally approved this treatment plan.  Francis KATHEE Macintosh, [facility]

## 2024-06-24 ENCOUNTER — Ambulatory Visit (INDEPENDENT_AMBULATORY_CARE_PROVIDER_SITE_OTHER): Admitting: Psychology

## 2024-06-24 DIAGNOSIS — F1020 Alcohol dependence, uncomplicated: Secondary | ICD-10-CM

## 2024-06-24 DIAGNOSIS — F331 Major depressive disorder, recurrent, moderate: Secondary | ICD-10-CM

## 2024-06-24 NOTE — Progress Notes (Signed)
 [facility] Counselor/Therapist Progress Note  Patient ID: Matthew Mckinney, MRN: [id number],    Date: 06/24/2024  Time Spent: 55 mins  start time: 0900  end time: 0955  Treatment Type: Individual Therapy  Reported Symptoms: Pt presents via Caregility video for this session.  Pt grants consent for the session, stating he is in his home with no one else present; pt acknowledges the limits of virtual sessions.  I shared with pt that I am in my office with no one else here either.  Mental Status Exam: Appearance:  Casual     Behavior: Appropriate  Motor: Normal  Speech/Language:  Clear and Coherent  Affect: Appropriate  Mood: depressed  Thought process: normal  Thought content:   WNL  Sensory/Perceptual disturbances:   WNL  Orientation: oriented to person, place, and time/date  Attention: Good  Concentration: Good  Memory: WNL  Fund of knowledge:  Good  Insight:   Good  Judgment:  Good  Impulse Control: Good   Risk Assessment: Danger to Self:  No Self-injurious Behavior: No Danger to Others: No Duty to Warn:no Physical Aggression / Violence:No  Access to Firearms a concern: Yes ; pt agreed last session to give the keys to the safe to [name], but he has not done it yet.  He agrees to do it between now and next session. Gang Involvement:No   Subjective: Pt shares that I have been doing pretty well since our last session.  I am still sober (since 5/18) and I think I have might have a hernia.  I am going to the doctor tomorrow for an evaluation.  Pt shares that he has been having some belly discomfort but had not thought much about it.  [name] is continuing to recover; she has put her percentage recovery at about 35% so far.  Her first week of school was not great for her but it has gotten better since then.  I think things are normalizing.  Pt shares that [name]'s birthday was Monday and he took the day off to spend with her and they enjoyed the time  together.  They bought a puzzle to work on later.  Pt shares that work is the same and he continues to look for new jobs as well.  His family is doing well except his sister has two different types of thyroid cancer and she will likely have to have her thyroid gland removed.  His mom and dad are fine.  Pt shares that he is now on Wellbutrin for the past two weeks and has a follow up appt with his PCP on 10/1 to see how he is doing.  Pt shares his sleep is not great; he wakes up several times per night and has some difficulty falling back to sleep.  Encouraged pt to talk with his PCP about his sleep issues when he goes back to see her on 10/1.  Pt shares that he has continued to have some depressive thoughts but denies current SI, plan or intent.  Pt shares he has been to [place] twice since our last session and went to a family reunion on weekend and the next weekend he went to see both of his grandmothers; he enjoyed talking with them.  His mom went with him and he enjoyed spending time with them.  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  He wants to see more improvement than he is seeing and that is bothersome to pt; his instructor has told him that he sees improvement in his playing.  Pt shares he that he does follow the national and international goings on in our world and he does not like them but is not surprised by them.  Encouraged pt to continue to think about his self care activities and to engage with them as regularly as he can and we will meet in 2 wks for a follow up session.   Interventions: Cognitive Behavioral Therapy  Diagnosis: Alcohol use disorder, moderate, in controlled environment, dependence (HCC)  Major depressive disorder, recurrent episode, moderate (HCC)  Plan: Treatment Plan Strengths/Abilities:  Intelligent,  Intuitive, Willing to participate in therapy Treatment Preferences:  Outpatient Individual Therapy Statement of Needs:  Patient is to use CBT, mindfulness and coping skills to help manage and/or decrease symptoms associated with their diagnosis. Symptoms:  Depressed/Irritable mood, worry, social withdrawal Problems Addressed:  Depressive thoughts, Sadness, Sleep issues, etc. Long Term Goals:  Pt to reduce overall level, frequency, and intensity of the feelings of depression as evidenced by decreased irritability, negative self talk, and helpless feelings from 6 to 7 days/week to 0 to 1 days/week, per client report, for at least 3 consecutive months.  Progress: 30% Short Term Goals:  Pt to verbally express understanding of the relationship between feelings of depression and their impact on thinking patterns and behaviors.  Pt to verbalize an understanding of the role that distorted thinking plays in creating fears, excessive worry, and ruminations.  Progress: 30% Target Date:  08/14/2024 Frequency:  Bi-weekly Modality:  Cognitive Behavioral Therapy Interventions by Therapist:  Therapist will use CBT, Mindfulness exercises, Coping skills and Referrals, as needed by client. Client has verbally approved this treatment plan.  Francis KATHEE Macintosh, [facility]

## 2024-07-08 ENCOUNTER — Ambulatory Visit: Admitting: Psychology

## 2024-07-08 DIAGNOSIS — F331 Major depressive disorder, recurrent, moderate: Secondary | ICD-10-CM | POA: Diagnosis not present

## 2024-07-08 DIAGNOSIS — F1020 Alcohol dependence, uncomplicated: Secondary | ICD-10-CM | POA: Diagnosis not present

## 2024-07-08 NOTE — Progress Notes (Signed)
 [facility] Counselor/Therapist Progress Note  Patient ID: SHEIKH LEVERICH, MRN: [id number],    Date: 07/08/2024  Time Spent: 60 mins  start time: 1000  end time: 1100  Treatment Type: Individual Therapy  Reported Symptoms: Pt presents via Caregility video for this session.  Pt grants consent for the session, stating he is in his home with no one else present; pt acknowledges the limits of virtual sessions.  I shared with pt that I am in my office with no one else here either.  Mental Status Exam: Appearance:  Casual     Behavior: Appropriate  Motor: Normal  Speech/Language:  Clear and Coherent  Affect: Appropriate  Mood: depressed  Thought process: normal  Thought content:   WNL  Sensory/Perceptual disturbances:   WNL  Orientation: oriented to person, place, and time/date  Attention: Good  Concentration: Good  Memory: WNL  Fund of knowledge:  Good  Insight:   Good  Judgment:  Good  Impulse Control: Good   Risk Assessment: Danger to Self:  No Self-injurious Behavior: No Danger to Others: No Duty to Warn:no Physical Aggression / Violence:No  Access to Firearms a concern: Yes ; pt agreed last session to give the keys to the safe to [name], but he has not done it yet.  He agrees to do it between now and next session. Gang Involvement:No   Subjective: Pt shares that I have been doing pretty well since our last session.  I am still sober (since 5/18) and I haven't been feeling like I want to drink.  I have been on Wellbutrin now for 4 weeks and I do not think I feel any different but I have noticed something weird.  I find myself laughing at TV shows in a way that I have not done before.  That is odd to me.  Pt shares that he saw a TV show recently that talked about existential nihilism and he looked it up and believes that is why he feels what he feels.  Pt shares that he thinks he found his perspective in existential nihilism; talked with pt about what  that means for him and how he feels about that.  Pt shares that he did talk with his PCP about his suspected hernia and he does not have one and no changes in his activities are needed.  [name] is continuing to recover; they saw the doctor on Monday and she can now participate in PE as she feels comfortable; the doctor was pleased with her progress.  Her surgery was 7/15.  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  Encouraged pt to continue to think about his self care activities and to engage with them as regularly as he can and we will meet in 2 wks for a follow up session.   Interventions: Cognitive Behavioral Therapy  Diagnosis: Alcohol use disorder, moderate, in controlled environment, dependence (HCC)  Major depressive disorder, recurrent episode, moderate (HCC)  Plan: Treatment Plan Strengths/Abilities:  Intelligent, Intuitive, Willing to participate in therapy Treatment Preferences:  Outpatient Individual Therapy Statement of Needs:  Patient is to use CBT, mindfulness and coping skills to help manage and/or decrease symptoms associated with their diagnosis. Symptoms:  Depressed/Irritable mood, worry, social withdrawal Problems Addressed:  Depressive thoughts, Sadness, Sleep issues, etc. Long Term Goals:  Pt to reduce overall level, frequency, and intensity of the feelings of depression as evidenced by decreased irritability, negative self  talk, and helpless feelings from 6 to 7 days/week to 0 to 1 days/week, per client report, for at least 3 consecutive months.  Progress: 30% Short Term Goals:  Pt to verbally express understanding of the relationship between feelings of depression and their impact on thinking patterns and behaviors.  Pt to verbalize an understanding of the role that distorted thinking plays in creating fears, excessive worry, and ruminations.  Progress: 30% Target Date:  08/14/2024 Frequency:   Bi-weekly Modality:  Cognitive Behavioral Therapy Interventions by Therapist:  Therapist will use CBT, Mindfulness exercises, Coping skills and Referrals, as needed by client. Client has verbally approved this treatment plan.  Francis KATHEE Macintosh, [facility]

## 2024-07-23 ENCOUNTER — Ambulatory Visit (INDEPENDENT_AMBULATORY_CARE_PROVIDER_SITE_OTHER): Admitting: Psychology

## 2024-07-23 DIAGNOSIS — F1020 Alcohol dependence, uncomplicated: Secondary | ICD-10-CM

## 2024-07-23 DIAGNOSIS — F331 Major depressive disorder, recurrent, moderate: Secondary | ICD-10-CM

## 2024-07-23 NOTE — Progress Notes (Signed)
  Behavioral [facility] Counselor/Therapist Progress Note  Patient ID: Matthew Mckinney, MRN: [id number],    Date: 07/23/2024  Time Spent: 55 mins  start time: 0900  end time: 0955  Treatment Type: Individual Therapy  Reported Symptoms: Pt presents via Caregility video for this session.  Pt grants consent for the session, stating he is in his home with no one else present; pt acknowledges the limits of virtual sessions.  I shared with pt that I am in my office with no one else here either.  Mental Status Exam: Appearance:  Casual     Behavior: Appropriate  Motor: Normal  Speech/Language:  Clear and Coherent  Affect: Appropriate  Mood: depressed  Thought process: normal  Thought content:   WNL  Sensory/Perceptual disturbances:   WNL  Orientation: oriented to person, place, and time/date  Attention: Good  Concentration: Good  Memory: WNL  Fund of knowledge:  Good  Insight:   Good  Judgment:  Good  Impulse Control: Good   Risk Assessment: Danger to Self:  No Self-injurious Behavior: No Danger to Others: No Duty to Warn:no Physical Aggression / Violence:No  Access to Firearms a concern: Yes ; pt agreed last session to give the keys to the safe to [name], but he has not done it yet.  He agrees to do it between now and next session. Gang Involvement:No   Subjective: Pt shares that I have been doing pretty well since our last session.  I am still sober (since 5/18) and I haven't been feeling like I want to drink.  I have been on Wellbutrin now for 6 weeks and I do not think I feel any different but that I think it is beneficial for me.  I am trying to decide if I need to ask for an increase in dose.  I have been asking [name] about things that she has seen in me in the past 6 weeks since I have been on the Wellbutrin.  Pt shares he is thinking about requesting an increase in the dose.  Pt again mentions his reading about existential nihilism; he is wondering if it might take  some pressure off of him from having to change everything.  It has allowed him to use the freeing perspective of it to allow him to do things that are good for him.  Pt continues to take guitar lessons and taking [place] lessons on his phone and he is still running.  He is looking for progress in all areas and is enjoying that.  He is finding some difficulty finding motivation to focus on just one thing at this time.  [name] is doing well with her recovery (surgery was 7/15); she has a follow up appt in December and can now play in PE, within reason.  Pt shares that he continues to look for jobs but the current political climate is making him want to sit tight where he is for right now.  Encouraged pt to continue to think about his self care activities and to engage with them as regularly as he can and we will meet in 2 wks for a follow up session.   Interventions: Cognitive Behavioral Therapy  Diagnosis: Alcohol use disorder, moderate, in controlled environment, dependence (HCC)  Major depressive disorder, recurrent episode, moderate (HCC)  Plan: Treatment Plan Strengths/Abilities:  Intelligent, Intuitive, Willing to participate in therapy Treatment Preferences:  Outpatient Individual Therapy Statement of Needs:  Patient is to use CBT, mindfulness and coping skills to help manage and/or decrease  symptoms associated with their diagnosis. Symptoms:  Depressed/Irritable mood, worry, social withdrawal Problems Addressed:  Depressive thoughts, Sadness, Sleep issues, etc. Long Term Goals:  Pt to reduce overall level, frequency, and intensity of the feelings of depression as evidenced by decreased irritability, negative self talk, and helpless feelings from 6 to 7 days/week to 0 to 1 days/week, per client report, for at least 3 consecutive months.  Progress: 30% Short Term Goals:  Pt to verbally express understanding of the relationship between feelings of depression and their impact on thinking patterns  and behaviors.  Pt to verbalize an understanding of the role that distorted thinking plays in creating fears, excessive worry, and ruminations.  Progress: 30% Target Date:  08/14/2025 Frequency:  Bi-weekly Modality:  Cognitive Behavioral Therapy Interventions by Therapist:  Therapist will use CBT, Mindfulness exercises, Coping skills and Referrals, as needed by client. Client has verbally approved this treatment plan.  Francis KATHEE Macintosh, [facility]

## 2024-08-05 ENCOUNTER — Ambulatory Visit: Admitting: Psychology

## 2024-08-05 DIAGNOSIS — F1020 Alcohol dependence, uncomplicated: Secondary | ICD-10-CM

## 2024-08-05 DIAGNOSIS — F331 Major depressive disorder, recurrent, moderate: Secondary | ICD-10-CM

## 2024-08-05 NOTE — Progress Notes (Signed)
 [name] Counselor/Therapist Progress Note  Patient ID: Matthew Mckinney, MRN: [id number],    Date: 08/05/2024  Time Spent: 58 mins  start time: 0900  end time: 0958  Treatment Type: Individual Therapy  Reported Symptoms: Pt presents via Caregility video for this session.  Pt grants consent for the session, stating he is in his home with no one else present; pt acknowledges the limits of virtual sessions.  I shared with pt that I am in my office with no one else here either.  Mental Status Exam: Appearance:  Casual     Behavior: Appropriate  Motor: Normal  Speech/Language:  Clear and Coherent  Affect: Appropriate  Mood: depressed  Thought process: normal  Thought content:   WNL  Sensory/Perceptual disturbances:   WNL  Orientation: oriented to person, place, and time/date  Attention: Good  Concentration: Good  Memory: WNL  Fund of knowledge:  Good  Insight:   Good  Judgment:  Good  Impulse Control: Good   Risk Assessment: Danger to Self:  No Self-injurious Behavior: No Danger to Others: No Duty to Warn:no Physical Aggression / Violence:No  Access to Firearms a concern: Yes ; pt agreed last session to give the keys to the safe to Matthew Mckinney, but he has not done it yet.  He agrees to do it between now and next session. Gang Involvement:No   Subjective: Pt shares that I have been doing pretty well since our last session.  I like handing out candy for Halloween; I usually sit at the end of the driveway and drink a few beers and hand out candy.  I have decided that it will be better for me to not drink this year; I want to do what is good for me this year.  Pt also mentions that he might want to drink for Thanksgiving; his internal dialog has started already and he normally drinks to excess.  Encouraged pt to be as intentional as he can be in his thinking about this topic.  Pt shares that he has, with his PCP's approval has increased his dose of Wellbutrin from  150mg  to 300mg  for the past 2 wks.  Pt reports that he has been thinking that his mood is lighter since then.  Pt shares he is glad that his mood is lighter.  Pt shares that work is going OK; he shares that his mood at work has been less negative as well and he appreciates that.  Pt shares that he has not intentionally shared with Matthew Mckinney that he feels better on the increased dose.  Matthew Mckinney has worked this Oncologist and it is finished now.  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  Pt shares that his band normally practices weekly and he enjoys that.  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  Pt shares that Matthew Mckinney has pretty much recovered from her surgery (7/15) and seems to be her normal self again.  She still has a follow up with the surgeon in Dec.  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  Pt continues to run about twice per week and he enjoys that too.  Pt shares that he continues to look for another job regularly but seems not to hear back from anyone yet.  Pt continues to have concerns about the current political environment.  Encouraged pt to continue to think about his self care activities and to engage with them as regularly as he can and we will meet in 2 wks for a follow up session.   Interventions: Cognitive Behavioral Therapy  Diagnosis: Alcohol use disorder, moderate, in controlled environment, dependence (HCC)  Major depressive disorder, recurrent episode, moderate (HCC)  Plan: Treatment Plan Strengths/Abilities:  Intelligent, Intuitive, Willing to participate in  therapy Treatment Preferences:  Outpatient Individual Therapy Statement of Needs:  Patient is to use CBT, mindfulness and coping skills to help manage and/or decrease symptoms associated with their diagnosis. Symptoms:  Depressed/Irritable mood, worry, social withdrawal Problems Addressed:  Depressive thoughts, Sadness, Sleep issues, etc. Long Term Goals:  Pt to reduce overall level, frequency, and intensity of the feelings of depression as evidenced by decreased irritability, negative self talk, and helpless feelings from 6 to 7 days/week to 0 to 1 days/week, per client report, for at least 3 consecutive months.  Progress: 30% Short Term Goals:  Pt to verbally express understanding of the relationship between feelings of depression and their impact on thinking patterns and behaviors.  Pt to verbalize an understanding of the role that distorted thinking plays in creating fears, excessive worry, and ruminations.  Progress: 30% Target Date:  08/14/2025 Frequency:  Bi-weekly Modality:  Cognitive Behavioral Therapy Interventions by Therapist:  Therapist will use CBT, Mindfulness exercises, Coping skills and Referrals, as needed by client. Client has verbally approved this treatment plan.  Matthew Mckinney, [facility]

## 2024-08-19 ENCOUNTER — Ambulatory Visit: Admitting: Psychology

## 2024-08-19 DIAGNOSIS — F1020 Alcohol dependence, uncomplicated: Secondary | ICD-10-CM

## 2024-08-19 DIAGNOSIS — F331 Major depressive disorder, recurrent, moderate: Secondary | ICD-10-CM

## 2024-08-19 NOTE — Progress Notes (Signed)
 [facility] Counselor/Therapist Progress Note  Patient ID: Matthew Mckinney, MRN: [id number],    Date: 08/19/2024  Time Spent: 60 mins  start time: 1000  end time: 1100  Treatment Type: Individual Therapy  Reported Symptoms: Pt presents via Caregility video for this session.  Pt grants consent for the session, stating he is in his home with no one else present; pt acknowledges the limits of virtual sessions.  I shared with pt that I am in my office with no one else here either.  Mental Status Exam: Appearance:  Casual     Behavior: Appropriate  Motor: Normal  Speech/Language:  Clear and Coherent  Affect: Appropriate  Mood: depressed  Thought process: normal  Thought content:   WNL  Sensory/Perceptual disturbances:   WNL  Orientation: oriented to person, place, and time/date  Attention: Good  Concentration: Good  Memory: WNL  Fund of knowledge:  Good  Insight:   Good  Judgment:  Good  Impulse Control: Good   Risk Assessment: Danger to Self:  No Self-injurious Behavior: No Danger to Others: No Duty to Warn:no Physical Aggression / Violence:No  Access to Firearms a concern: Yes ; pt agreed last session to give the keys to the safe to [name], but he has not done it yet.  He agrees to do it between now and next session. Gang Involvement:No   Notified: Yes  Subjective: Pt shares that I have been doing pretty well since our last session; no real changes.  Work is still annoying and not fun.  It is our review time and my boss told me I will have a good review.  He was showing me that he has a plan that he wants to create a new position for me to be promoted to with more income and better responsibilities.  It would not likely take effect until 2027.  Pt appreciates his bosses' respect for him and their efforts to keep him satisfied.  Pt has been playing guitar and video games; he continues to take lessons and enjoys his guitar lessons.  I know I need to play more  to get better; it is a fun activity for me.  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  He is thinking about telling the band that he wants to quit doing that; he enjoys his time with the band so me may not quit.  He is still trying to decide.  Pt shares that [name] and the kids are good; [name] is a bit heavy and is 44 yo.  They have tried to talk with her in the past about this and those conversations have not gone well.  Pt shares that he, [name], and [name] all have exercise outlets and [name] does not; she also eats more calories than she needs.  Talked with pt about talking with her pediatrician about weight loss ideas; pt wants to talk about the family beginning to take walks  as a family.  Pt shares that [name]'s father is coming to visit next Friday; he lives in [place] with her step mom and has for several years.  He has not seen the girls in about 6 yrs.  Pt is going to see his 2 yo grandmother with the family this Saturday in [place] to celebrate her.  Pt shares that Halloween was good for them; he and [name] handed out candy to little kids and the girls  went with their friends and have fun.  Pt shares that he continues to be sober; he was thinking last session that he might drink wine at Thanksgiving; he now is thinking that he will not drink over Thanksgiving at all; I think it will just be easier not to drink.  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  Pt continues to take his Wellbutrin 300 mg and feels it is going well.  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  Pt continues to run about twice per week and he enjoys that too.  Pt shares that he continues to look for another job regularly but seems not to hear back from anyone yet.  Pt continues to have concerns about the current political  environment.  Encouraged pt to continue to think about his self care activities and to engage with them as regularly as he can and we will meet in 2 wks for a follow up session.   Interventions: Cognitive Behavioral Therapy  Diagnosis: Alcohol use disorder, moderate, in controlled environment, dependence (HCC)  Major depressive disorder, recurrent episode, moderate (HCC)  Plan: Treatment Plan Strengths/Abilities:  Intelligent, Intuitive, Willing to participate in therapy Treatment Preferences:  Outpatient Individual Therapy Statement of Needs:  Patient is to use CBT, mindfulness and coping skills to help manage and/or decrease symptoms associated with their diagnosis. Symptoms:  Depressed/Irritable mood, worry, social withdrawal Problems Addressed:  Depressive thoughts, Sadness, Sleep issues, etc. Long Term Goals:  Pt to reduce overall level, frequency, and intensity of the feelings of depression as evidenced by decreased irritability, negative self talk, and helpless feelings from 6 to 7 days/week to 0 to 1 days/week, per client report, for at least 3 consecutive months.  Progress: 30% Short Term Goals:  Pt to verbally express understanding of the relationship between feelings of depression and their impact on thinking patterns and behaviors.  Pt to verbalize an understanding of the role that distorted thinking plays in creating fears, excessive worry, and ruminations.  Progress: 30% Target Date:  08/14/2025 Frequency:  Bi-weekly Modality:  Cognitive Behavioral Therapy Interventions by Therapist:  Therapist will use CBT, Mindfulness exercises, Coping skills and Referrals, as needed by client. Client has verbally approved this treatment plan.  Francis KATHEE Macintosh, [facility]

## 2024-09-10 ENCOUNTER — Ambulatory Visit: Admitting: Psychology

## 2024-09-10 DIAGNOSIS — F1020 Alcohol dependence, uncomplicated: Secondary | ICD-10-CM

## 2024-09-10 DIAGNOSIS — F331 Major depressive disorder, recurrent, moderate: Secondary | ICD-10-CM

## 2024-09-10 NOTE — Progress Notes (Signed)
 [facility] Counselor/Therapist Progress Note  Patient ID: ZAQUAN DUFFNER, MRN: [id number],    Date: 09/10/2024  Time Spent: 56 mins  start time: 1004  end time: 1100  Treatment Type: Individual Therapy  Reported Symptoms: Pt presents via Caregility video for this session.  Pt grants consent for the session, stating he is in his home with no one else present; pt acknowledges the limits of virtual sessions.  I shared with pt that I am in my office with no one else here either.  Mental Status Exam: Appearance:  Casual     Behavior: Appropriate  Motor: Normal  Speech/Language:  Clear and Coherent  Affect: Appropriate  Mood: depressed  Thought process: normal  Thought content:   WNL  Sensory/Perceptual disturbances:   WNL  Orientation: oriented to person, place, and time/date  Attention: Good  Concentration: Good  Memory: WNL  Fund of knowledge:  Good  Insight:   Good  Judgment:  Good  Impulse Control: Good   Risk Assessment: Danger to Self:  No Self-injurious Behavior: No Danger to Others: No Duty to Warn:no Physical Aggression / Violence:No  Access to Firearms a concern: Yes ; pt agreed last session to give the keys to the safe to [name], but he has not done it yet.  He agrees to do it between now and next session. Gang Involvement:No   Notified: Yes  Subjective: Pt shares that I have been doing pretty well since our last session; no real changes.  We had a good Thanksgiving at my parents' home with the normal cast of characters.  Food was good as normal.  Also went to [place] to see my grandmother (75 yo) for her birthday and that was a good visit.  Pt shares that his grandmother told them she intends to live to be 44 yo for some reason.  Pt shares that work is going about the same; he had to provide coverage for work and had to do one work related task on Thanksgiving day.  Pt shares that his boss is really good to him by keeping track of his extra work.  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  He stayed sober during the event and was glad that he had done that.  He does share that he was a little disappointed that he didn't have anything to drink during the event.  He is ultimately glad that he did not drink during that event.  He also shares that he did not drink at Thanksgiving either.  He is happy that he has not been drinking.  Pt shares that he has been sleeping pretty well; he is doing a home sleep study on 12/9 and is curious to see what the report will say.  Pt continues to take his Wellbutrin 300 mg and feels it is beneficial for him.  [name]'s dad came in from [place] for a couple of days the weekend before Thanksgiving and they had a nice visit.  Pt has continued to play his guitar and practice and he enjoys that.  Pt shares that [name] and the kids are good; [name] is a bit heavy and is 44 yo.  They go back to see the surgeon on 12/29 for another follow up appt; she is having not additional issues.  Pt shares that the weather has not been such that he has not been able to run; it  has been eating season.  Pt has taken a break from job searching right now.  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  Pt continues to have concerns about the current political environment.  Encouraged pt to continue to think about his self care activities and to engage with them as regularly as he can and we will meet in 2 wks for a follow up session.   Interventions: Cognitive Behavioral Therapy  Diagnosis: Alcohol use disorder, moderate, in controlled environment, dependence (HCC)  Major depressive disorder, recurrent episode, moderate (HCC)  Plan: Treatment Plan Strengths/Abilities:  Intelligent, Intuitive, Willing to participate in  therapy Treatment Preferences:  Outpatient Individual Therapy Statement of Needs:  Patient is to use CBT, mindfulness and coping skills to help manage and/or decrease symptoms associated with their diagnosis. Symptoms:  Depressed/Irritable mood, worry, social withdrawal Problems Addressed:  Depressive thoughts, Sadness, Sleep issues, etc. Long Term Goals:  Pt to reduce overall level, frequency, and intensity of the feelings of depression as evidenced by decreased irritability, negative self talk, and helpless feelings from 6 to 7 days/week to 0 to 1 days/week, per client report, for at least 3 consecutive months.  Progress: 30% Short Term Goals:  Pt to verbally express understanding of the relationship between feelings of depression and their impact on thinking patterns and behaviors.  Pt to verbalize an understanding of the role that distorted thinking plays in creating fears, excessive worry, and ruminations.  Progress: 30% Target Date:  08/14/2025 Frequency:  Bi-weekly Modality:  Cognitive Behavioral Therapy Interventions by Therapist:  Therapist will use CBT, Mindfulness exercises, Coping skills and Referrals, as needed by client. Client has verbally approved this treatment plan.  Francis KATHEE Macintosh, [facility]

## 2024-09-24 ENCOUNTER — Ambulatory Visit (INDEPENDENT_AMBULATORY_CARE_PROVIDER_SITE_OTHER): Admitting: Psychology

## 2024-09-24 DIAGNOSIS — F331 Major depressive disorder, recurrent, moderate: Secondary | ICD-10-CM | POA: Diagnosis not present

## 2024-09-24 DIAGNOSIS — F1020 Alcohol dependence, uncomplicated: Secondary | ICD-10-CM | POA: Diagnosis not present

## 2024-09-24 NOTE — Progress Notes (Signed)
 [facility] Counselor/Therapist Progress Note  Patient ID: Matthew Mckinney, MRN: [id number],    Date: 09/24/2024  Time Spent: 54 mins  start time: 0904  end time: 0958  Treatment Type: Individual Therapy  Reported Symptoms: Pt presents via Caregility video for this session.  Pt grants consent for the session, stating he is in his home with no one else present; pt acknowledges the limits of virtual sessions.  I shared with pt that I am in my office with no one else here either.  Mental Status Exam: Appearance:  Casual     Behavior: Appropriate  Motor: Normal  Speech/Language:  Clear and Coherent  Affect: Appropriate  Mood: depressed  Thought process: normal  Thought content:   WNL  Sensory/Perceptual disturbances:   WNL  Orientation: oriented to person, place, and time/date  Attention: Good  Concentration: Good  Memory: WNL  Fund of knowledge:  Good  Insight:   Good  Judgment:  Good  Impulse Control: Good   Risk Assessment: Danger to Self:  No Self-injurious Behavior: No Danger to Others: No Duty to Warn:no Physical Aggression / Violence:No  Access to Firearms a concern: Yes ; pt agreed last session to give the keys to the safe to [name], but he has not done it yet.  He agrees to do it between now and next session. Gang Involvement:No   Notified of retirement  Subjective: Pt shares that I have been doing pretty well since our last session; no real changes.  I did a home sleep study last night and will take the machine back today.  If they tell me I have apnea I am pretty sure I will not agree to a CPAP machine.  I just don't want to have something on my face.  Pt shares he does not wake up refreshed in the mornings and [name] says he snores a lot during the night.  Pt shares he wakes up most nights and often has trouble falling back to sleep.  Pt is taking the next two weeks off for the holidays.  Pt has very few plans for his time off; he wants to play  guitar and he plans to take several runs with the better weather coming in the next couple of weeks.  Pt shares he is going to his mom and dad's for Christmas day lunch and presents.  Pt shares his mom applied for a job with the [employer] because she and her dad are both 32 yo and they don't have health insurance.  His dad was laid off at 42 yo and has not been able to get a job since then.  By the way, we bought a new bathroom; at $13K bathroom.  We have mold in our bathroom and it needs to be replaced so we had to do it.  Pt continues with his sobriety; It is really not that hard.  It seems easier to not drink now.  I would rather drink but I do feel better when I don't drink.  Pt continues to take his Wellbutrin 300 mg and feels it is beneficial for him.  Pt has continued to play his guitar and practice and he enjoys that.  They go back to see the surgeon on 12/29 for another follow up appt; she is having not additional issues.  Pt continues to have concerns about the current political environment.  Encouraged pt to continue to think about his self care activities and to engage with them as regularly as he  can and we will meet in 3 wks for a follow up session, due to the holidays.   Interventions: Cognitive Behavioral Therapy  Diagnosis: Alcohol use disorder, moderate, in controlled environment, dependence (HCC)  Major depressive disorder, recurrent episode, moderate (HCC)  Plan: Treatment Plan Strengths/Abilities:  Intelligent, Intuitive, Willing to participate in therapy Treatment Preferences:  Outpatient Individual Therapy Statement of Needs:  Patient is to use CBT, mindfulness and coping skills to help manage and/or decrease symptoms associated with their diagnosis. Symptoms:  Depressed/Irritable mood, worry, social withdrawal Problems Addressed:  Depressive thoughts, Sadness, Sleep issues, etc. Long Term Goals:  Pt to reduce overall level, frequency, and intensity of the feelings of depression  as evidenced by decreased irritability, negative self talk, and helpless feelings from 6 to 7 days/week to 0 to 1 days/week, per client report, for at least 3 consecutive months.  Progress: 30% Short Term Goals:  Pt to verbally express understanding of the relationship between feelings of depression and their impact on thinking patterns and behaviors.  Pt to verbalize an understanding of the role that distorted thinking plays in creating fears, excessive worry, and ruminations.  Progress: 30% Target Date:  08/14/2025 Frequency:  Bi-weekly Modality:  Cognitive Behavioral Therapy Interventions by Therapist:  Therapist will use CBT, Mindfulness exercises, Coping skills and Referrals, as needed by client. Client has verbally approved this treatment plan.  Francis KATHEE Macintosh, [facility]

## 2024-10-21 ENCOUNTER — Ambulatory Visit: Admitting: Psychology

## 2024-10-21 DIAGNOSIS — F1020 Alcohol dependence, uncomplicated: Secondary | ICD-10-CM

## 2024-10-21 DIAGNOSIS — F331 Major depressive disorder, recurrent, moderate: Secondary | ICD-10-CM

## 2024-10-21 NOTE — Progress Notes (Signed)
 [facility] Counselor/Therapist Progress Note  Patient ID: Matthew Mckinney, MRN: [id number],    Date: 10/21/2024  Time Spent: 49 mins  start time: 0904  end time: 0953  Treatment Type: Individual Therapy  Reported Symptoms: Pt presents via Caregility video for this session.  Pt grants consent for the session, stating he is in his home with no one else present; pt acknowledges the limits of virtual sessions.  I shared with pt that I am in my office with no one else here either.  Mental Status Exam: Appearance:  Casual     Behavior: Appropriate  Motor: Normal  Speech/Language:  Clear and Coherent  Affect: Appropriate  Mood: depressed  Thought process: normal  Thought content:   WNL  Sensory/Perceptual disturbances:   WNL  Orientation: oriented to person, place, and time/date  Attention: Good  Concentration: Good  Memory: WNL  Fund of knowledge:  Good  Insight:   Good  Judgment:  Good  Impulse Control: Good   Risk Assessment: Danger to Self:  No Self-injurious Behavior: No Danger to Others: No Duty to Warn:no Physical Aggression / Violence:No  Access to Firearms a concern: Yes ; pt agreed last session to give the keys to the safe to [name], but he has not done it yet.  He agrees to do it between now and next session. Gang Involvement:No   Notified of retirement  Subjective: Pt shares that I have been doing pretty well since our last session; I took two weeks off for Christmas and I wish I had taken another week off.  I cooked a prime rib for Christmas and it was great.  Everybody had a great time.  We did go to the movies to see the new Spongebob movie and that was fun.  Pt shares that his parents are going through trying to get health insurance since neither of them are working right now.  Pt shares that work is going OK; last week was hectic but this week is better.  Pt shares that he is back at looking for jobs but he can manage his current job well too.   Pt shares he did get the results of his sleep study and he does have apnea; he did agree to get a CPAP machine and it should come next week.  Pt met with his sleep doctor and got the results; the doctor told me I had terrible sleep apnea; there are 10 mins each night that I am not breathing.  That was scary so I had to agree to it.  They are also having to have their shower replaced next Wednesday and Thursday; it will cost them $550.00 per month for 2 yrs.  They have had a second plumbing issue earlier this week and were able to get that fixed pretty quickly.  Pt continues to take his Wellbutrin daily.  He also wakes up each day with a headache and he wonders if it is connected to his medication.  Pt does believe that the Wellbutrin has been beneficial for him.  He is planning to talk with his PCP soon about the headache and a dosage increase for his Wellbutrin.  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  Pt shares he is still sober and has been since last May; it is easier than it once was.  He was sober from November 2024 to one week in May 2025 and has been sober  since then.  Their follow up with [name]'s surgeon on 12/29 went well and she is fully released to all activities; she is going horseback riding with a friend on Saturday and she is looking forward.  Pt has been running twice since Christmas and he feels good about that; he hopes to go again today if it doesn't rain.  Pt continues to have concerns about the current political environment.  Encouraged pt to continue to think about his self care activities and to engage with them as regularly as he can and we will meet in 2 wks for a follow up session.   Interventions: Cognitive Behavioral Therapy  Diagnosis: Alcohol use disorder, moderate, in controlled environment, dependence (HCC)  Major depressive disorder, recurrent episode, moderate (HCC)  Plan: Treatment  Plan Strengths/Abilities:  Intelligent, Intuitive, Willing to participate in therapy Treatment Preferences:  Outpatient Individual Therapy Statement of Needs:  Patient is to use CBT, mindfulness and coping skills to help manage and/or decrease symptoms associated with their diagnosis. Symptoms:  Depressed/Irritable mood, worry, social withdrawal Problems Addressed:  Depressive thoughts, Sadness, Sleep issues, etc. Long Term Goals:  Pt to reduce overall level, frequency, and intensity of the feelings of depression as evidenced by decreased irritability, negative self talk, and helpless feelings from 6 to 7 days/week to 0 to 1 days/week, per client report, for at least 3 consecutive months.  Progress: 30% Short Term Goals:  Pt to verbally express understanding of the relationship between feelings of depression and their impact on thinking patterns and behaviors.  Pt to verbalize an understanding of the role that distorted thinking plays in creating fears, excessive worry, and ruminations.  Progress: 30% Target Date:  08/14/2025 Frequency:  Bi-weekly Modality:  Cognitive Behavioral Therapy Interventions by Therapist:  Therapist will use CBT, Mindfulness exercises, Coping skills and Referrals, as needed by client. Client has verbally approved this treatment plan.  Francis KATHEE Macintosh, [facility]

## 2024-11-04 ENCOUNTER — Ambulatory Visit (INDEPENDENT_AMBULATORY_CARE_PROVIDER_SITE_OTHER): Admitting: Psychology

## 2024-11-04 DIAGNOSIS — F331 Major depressive disorder, recurrent, moderate: Secondary | ICD-10-CM | POA: Diagnosis not present

## 2024-11-04 DIAGNOSIS — F1020 Alcohol dependence, uncomplicated: Secondary | ICD-10-CM

## 2024-11-04 NOTE — Progress Notes (Signed)
 "  [name] Counselor/Therapist Progress Note  Patient ID: Matthew Mckinney, MRN: [id number],    Date: 11/04/2024  Time Spent: 58 mins  start time: 0900  end time: 0958  Treatment Type: Individual Therapy  Reported Symptoms: Pt presents via Caregility video for this session.  Pt grants consent for the session, stating he is in his home with no one else present; pt acknowledges the limits of virtual sessions.  I shared with pt that I am in my office with no one else here either.  Mental Status Exam: Appearance:  Casual     Behavior: Appropriate  Motor: Normal  Speech/Language:  Clear and Coherent  Affect: Appropriate  Mood: depressed  Thought process: normal  Thought content:   WNL  Sensory/Perceptual disturbances:   WNL  Orientation: oriented to person, place, and time/date  Attention: Good  Concentration: Good  Memory: WNL  Fund of knowledge:  Good  Insight:   Good  Judgment:  Good  Impulse Control: Good   Risk Assessment: Danger to Self:  No Self-injurious Behavior: No Danger to Others: No Duty to Warn:no Physical Aggression / Violence:No  Access to Firearms a concern: Yes ; pt agreed last session to give the keys to the safe to Matthew Mckinney, but he has not done it yet.  He agrees to do it between now and next session. Gang Involvement:No   Notified of retirement; pt wants a referral to a therapist in our referral  Subjective: Pt shares that I have been doing pretty well since our last session; we now have a brand new shower in our home and it is great; I am really happy with how it turned out.  Pt shares that he started sleeping with his CPAP machine on 1/19; he puts the mask on every night but he has not slept the whole night with the mask on.  He is starting to get irritated that it is not working well yet.  Talked with pt about ways to benefit from it and also talk to his PCP about the issue.  Pt shares his self care activities have been reduced to video  games because of the cold weather.  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  He has been watching a TV show about narrow boats and he is very interested in that topic now.  This has led him to other shows about narrow boats.  Pt shares that work is going OK but he still does not enjoy it but he does appreciate his boss' support of him.  Pt continues to take his Wellbutrin daily.  Pt shares that his daily headaches upon waking up have pretty much stopped.  He had an appt to see his PCP about it on Monday but that was canceled.  He will reschedule it as soon as he can.  He will also talk with her about the possibility of increasing his Wellbutrin dose.  Pt shares he is still sober and has been since last May; it is easier than it once was.  He was sober from November 2024 to one week in May 2025 and has been sober since then.  Pt continues to have concerns about the current political environment.  Encouraged pt to continue to think about his self care activities and to engage with them as regularly as he can and we will meet in 2 wks for  a follow up session.   Interventions: Cognitive Behavioral Therapy  Diagnosis: Alcohol use disorder, moderate, in controlled environment, dependence (HCC)  Major depressive disorder, recurrent episode, moderate (HCC)  Plan: Treatment Plan Strengths/Abilities:  Intelligent, Intuitive, Willing to participate in therapy Treatment Preferences:  Outpatient Individual Therapy Statement of Needs:  Patient is to use CBT, mindfulness and coping skills to help manage and/or decrease symptoms associated with their diagnosis. Symptoms:  Depressed/Irritable mood, worry, social withdrawal Problems Addressed:  Depressive thoughts, Sadness, Sleep issues, etc. Long Term Goals:  Pt to reduce overall level, frequency, and intensity of the feelings of depression as evidenced by decreased  irritability, negative self talk, and helpless feelings from 6 to 7 days/week to 0 to 1 days/week, per client report, for at least 3 consecutive months.  Progress: 30% Short Term Goals:  Pt to verbally express understanding of the relationship between feelings of depression and their impact on thinking patterns and behaviors.  Pt to verbalize an understanding of the role that distorted thinking plays in creating fears, excessive worry, and ruminations.  Progress: 30% Target Date:  08/14/2025 Frequency:  Bi-weekly Modality:  Cognitive Behavioral Therapy Interventions by Therapist:  Therapist will use CBT, Mindfulness exercises, Coping skills and Referrals, as needed by client. Client has verbally approved this treatment plan.  Francis KATHEE Macintosh, [facility] "

## 2024-11-18 ENCOUNTER — Ambulatory Visit: Admitting: Psychology
